# Patient Record
Sex: Male | Born: 1985
Health system: Southern US, Community
[De-identification: ages and names within clinical notes are randomized; demographics above are authoritative.]

## PROBLEM LIST (undated history)

## (undated) DIAGNOSIS — L309 Dermatitis, unspecified: Secondary | ICD-10-CM

## (undated) HISTORY — PX: APPENDECTOMY: SHX54

---

## 2005-10-25 ENCOUNTER — Emergency Department (HOSPITAL_COMMUNITY): Admission: EM | Admit: 2005-10-25 | Discharge: 2005-10-25 | Payer: Self-pay | Admitting: Emergency Medicine

## 2005-11-06 ENCOUNTER — Emergency Department (HOSPITAL_COMMUNITY): Admission: EM | Admit: 2005-11-06 | Discharge: 2005-11-06 | Payer: Self-pay | Admitting: *Deleted

## 2006-03-12 ENCOUNTER — Ambulatory Visit: Payer: Self-pay | Admitting: Family Medicine

## 2006-03-13 ENCOUNTER — Ambulatory Visit: Payer: Self-pay | Admitting: *Deleted

## 2006-06-12 ENCOUNTER — Ambulatory Visit: Payer: Self-pay | Admitting: Family Medicine

## 2006-06-18 ENCOUNTER — Ambulatory Visit: Payer: Self-pay | Admitting: Family Medicine

## 2006-11-05 ENCOUNTER — Ambulatory Visit: Payer: Self-pay | Admitting: Family Medicine

## 2006-12-10 ENCOUNTER — Ambulatory Visit: Payer: Self-pay | Admitting: Family Medicine

## 2006-12-10 ENCOUNTER — Ambulatory Visit (HOSPITAL_COMMUNITY): Admission: RE | Admit: 2006-12-10 | Discharge: 2006-12-10 | Payer: Self-pay | Admitting: Family Medicine

## 2006-12-24 ENCOUNTER — Emergency Department (HOSPITAL_COMMUNITY): Admission: EM | Admit: 2006-12-24 | Discharge: 2006-12-24 | Payer: Self-pay | Admitting: Emergency Medicine

## 2007-02-12 ENCOUNTER — Encounter (INDEPENDENT_AMBULATORY_CARE_PROVIDER_SITE_OTHER): Payer: Self-pay | Admitting: *Deleted

## 2007-04-06 ENCOUNTER — Emergency Department (HOSPITAL_COMMUNITY): Admission: EM | Admit: 2007-04-06 | Discharge: 2007-04-06 | Payer: Self-pay | Admitting: Emergency Medicine

## 2007-06-14 ENCOUNTER — Emergency Department (HOSPITAL_COMMUNITY): Admission: EM | Admit: 2007-06-14 | Discharge: 2007-06-14 | Payer: Self-pay | Admitting: Emergency Medicine

## 2007-06-16 ENCOUNTER — Emergency Department (HOSPITAL_COMMUNITY): Admission: EM | Admit: 2007-06-16 | Discharge: 2007-06-16 | Payer: Self-pay | Admitting: Emergency Medicine

## 2007-08-15 ENCOUNTER — Ambulatory Visit: Payer: Self-pay | Admitting: Family Medicine

## 2008-03-30 IMAGING — CR DG HIP (WITH OR WITHOUT PELVIS) 2-3V*L*
3 series · 3 of 3 positions shown · non-contrast
Comparison: None.

CLINICAL DATA: 21-year-old male with fall last night and pain to the lateral left hip.
 LEFT HIP ? 3 VIEW:

[t pelvis a.p.]
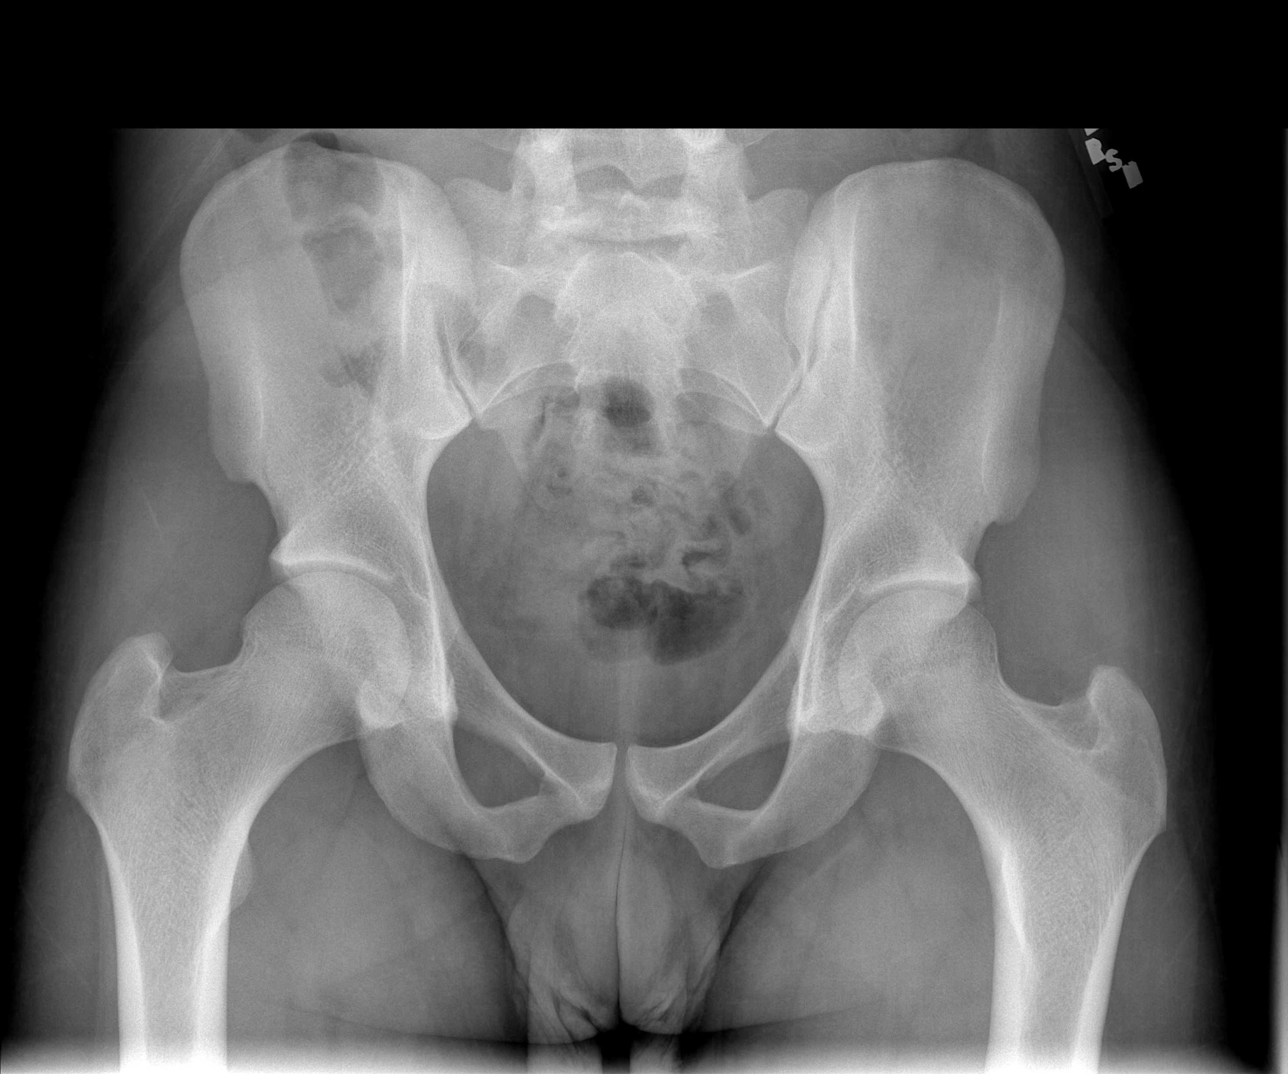

[t hip ap left]
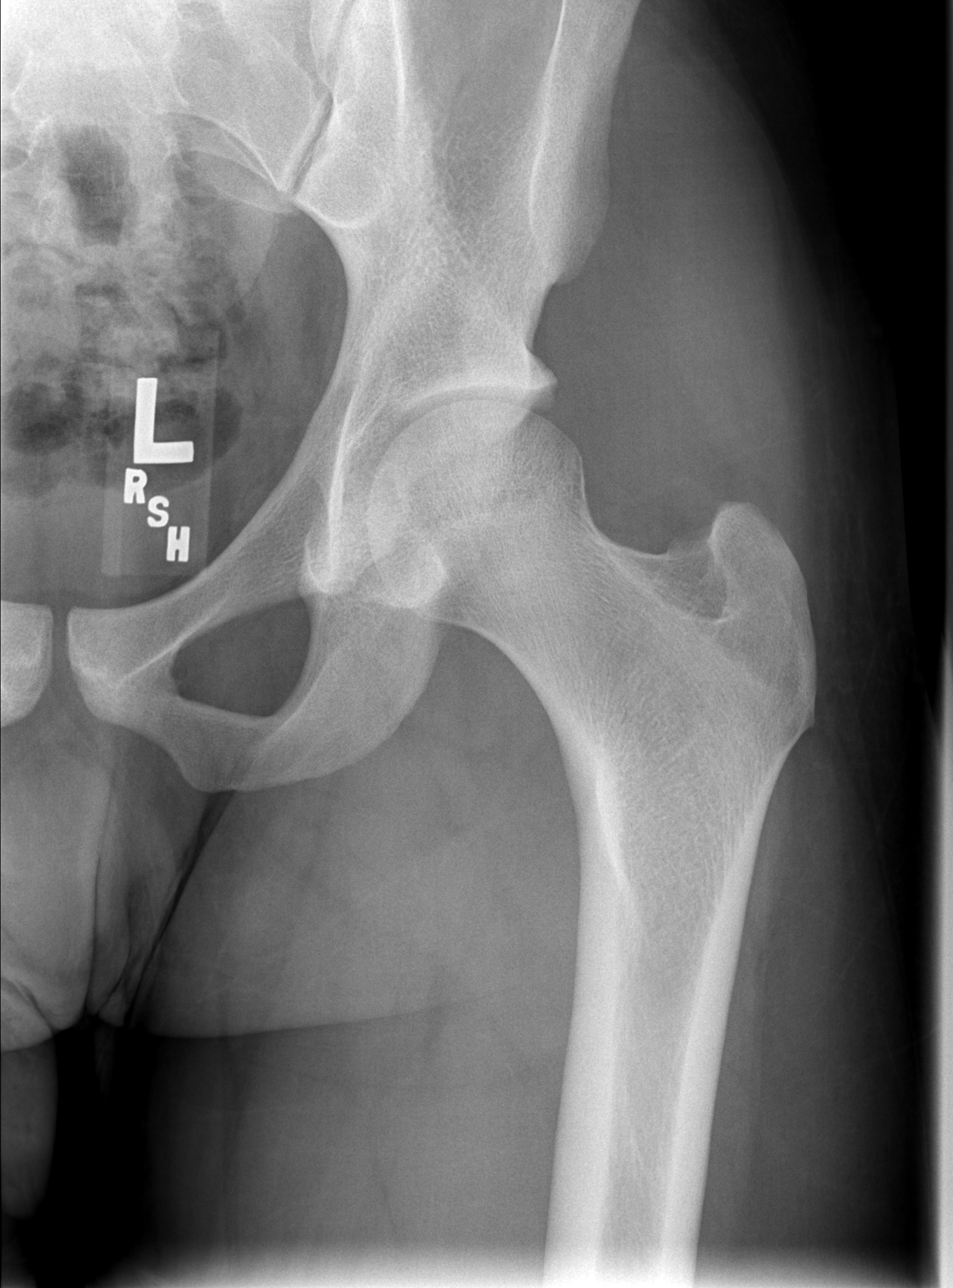

[t hip frog leg left]
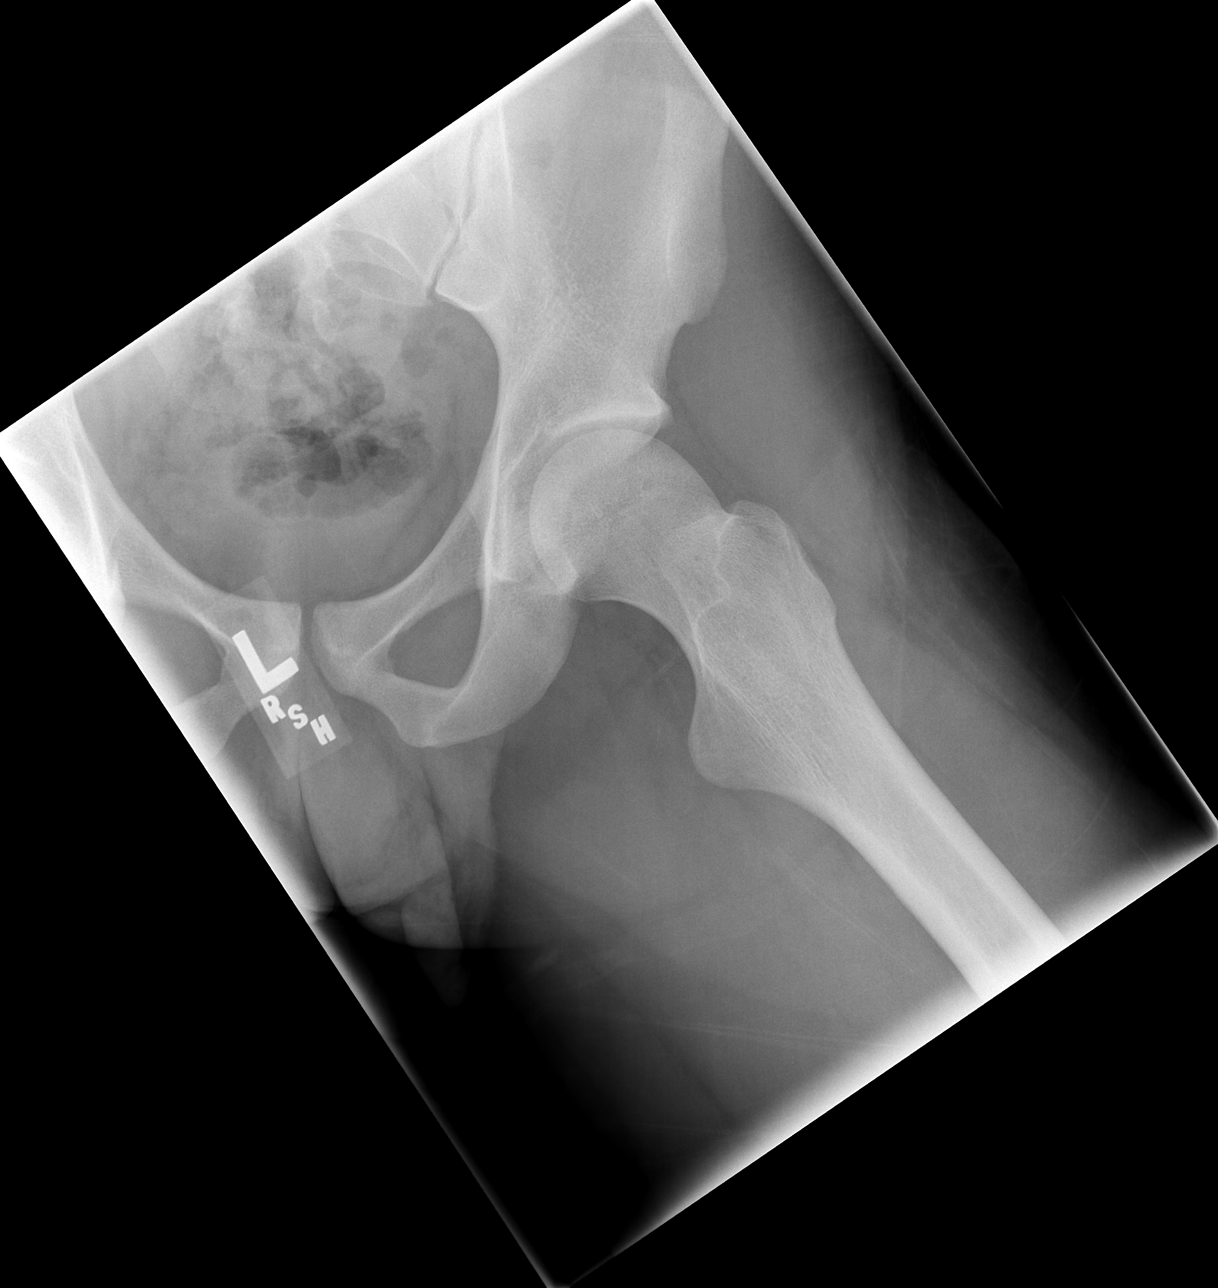

[3 of 3 positions shown; findings below may reference images not displayed]

FINDINGS: Normal bone mineralization.  AP view of the pelvis demonstrates the femoral heads are normally located.  Non-obstructed bowel gas pattern. The sacrum appears intact.  AP and frogleg lateral views of the left hip demonstrate normal appearance of the proximal left femur.  Normal joint space.
IMPRESSION: No acute fracture/dislocation seen about the left hip or pelvis.

## 2008-04-24 ENCOUNTER — Emergency Department (HOSPITAL_COMMUNITY): Admission: EM | Admit: 2008-04-24 | Discharge: 2008-04-24 | Payer: Self-pay | Admitting: Emergency Medicine

## 2008-09-04 ENCOUNTER — Emergency Department (HOSPITAL_COMMUNITY): Admission: EM | Admit: 2008-09-04 | Discharge: 2008-09-04 | Payer: Self-pay | Admitting: Emergency Medicine

## 2009-01-07 ENCOUNTER — Ambulatory Visit: Payer: Self-pay | Admitting: Family Medicine

## 2009-02-13 ENCOUNTER — Emergency Department (HOSPITAL_COMMUNITY): Admission: EM | Admit: 2009-02-13 | Discharge: 2009-02-13 | Payer: Self-pay | Admitting: Emergency Medicine

## 2009-03-22 ENCOUNTER — Observation Stay (HOSPITAL_COMMUNITY): Admission: EM | Admit: 2009-03-22 | Discharge: 2009-03-22 | Payer: Self-pay | Admitting: Emergency Medicine

## 2009-04-28 ENCOUNTER — Emergency Department (HOSPITAL_COMMUNITY): Admission: EM | Admit: 2009-04-28 | Discharge: 2009-04-28 | Payer: Self-pay | Admitting: Emergency Medicine

## 2009-05-10 ENCOUNTER — Observation Stay (HOSPITAL_COMMUNITY): Admission: EM | Admit: 2009-05-10 | Discharge: 2009-05-10 | Payer: Self-pay | Admitting: Emergency Medicine

## 2009-06-28 ENCOUNTER — Emergency Department (HOSPITAL_COMMUNITY): Admission: EM | Admit: 2009-06-28 | Discharge: 2009-06-28 | Payer: Self-pay | Admitting: Emergency Medicine

## 2010-01-03 ENCOUNTER — Ambulatory Visit: Payer: Self-pay | Admitting: Family Medicine

## 2010-09-16 ENCOUNTER — Emergency Department (HOSPITAL_COMMUNITY)
Admission: EM | Admit: 2010-09-16 | Discharge: 2010-09-16 | Disposition: A | Discharge status: Home / Self Care | Payer: Self-pay | Attending: Emergency Medicine | Admitting: Emergency Medicine

## 2010-09-16 DIAGNOSIS — R0789 Other chest pain: Secondary | ICD-10-CM | POA: Insufficient documentation

## 2010-09-16 DIAGNOSIS — R0602 Shortness of breath: Secondary | ICD-10-CM | POA: Insufficient documentation

## 2010-09-16 DIAGNOSIS — R0609 Other forms of dyspnea: Secondary | ICD-10-CM | POA: Insufficient documentation

## 2010-09-16 DIAGNOSIS — R0989 Other specified symptoms and signs involving the circulatory and respiratory systems: Secondary | ICD-10-CM | POA: Insufficient documentation

## 2010-09-16 DIAGNOSIS — J45901 Unspecified asthma with (acute) exacerbation: Secondary | ICD-10-CM | POA: Insufficient documentation

## 2011-01-02 ENCOUNTER — Inpatient Hospital Stay (INDEPENDENT_AMBULATORY_CARE_PROVIDER_SITE_OTHER)
Admission: RE | Admit: 2011-01-02 | Discharge: 2011-01-02 | Disposition: A | Discharge status: Home / Self Care | Payer: Self-pay | Source: Ambulatory Visit | Attending: Family Medicine | Admitting: Family Medicine

## 2011-01-02 DIAGNOSIS — J069 Acute upper respiratory infection, unspecified: Secondary | ICD-10-CM

## 2011-07-13 ENCOUNTER — Emergency Department (HOSPITAL_COMMUNITY)
Admission: EM | Admit: 2011-07-13 | Discharge: 2011-07-13 | Disposition: A | Discharge status: Home / Self Care | Payer: Self-pay | Attending: Emergency Medicine | Admitting: Emergency Medicine

## 2011-07-13 ENCOUNTER — Encounter (HOSPITAL_COMMUNITY): Payer: Self-pay | Admitting: *Deleted

## 2011-07-13 DIAGNOSIS — R05 Cough: Secondary | ICD-10-CM | POA: Insufficient documentation

## 2011-07-13 DIAGNOSIS — R062 Wheezing: Secondary | ICD-10-CM | POA: Insufficient documentation

## 2011-07-13 DIAGNOSIS — R059 Cough, unspecified: Secondary | ICD-10-CM | POA: Insufficient documentation

## 2011-07-13 DIAGNOSIS — F17200 Nicotine dependence, unspecified, uncomplicated: Secondary | ICD-10-CM | POA: Insufficient documentation

## 2011-07-13 DIAGNOSIS — R6883 Chills (without fever): Secondary | ICD-10-CM | POA: Insufficient documentation

## 2011-07-13 DIAGNOSIS — R0602 Shortness of breath: Secondary | ICD-10-CM | POA: Insufficient documentation

## 2011-07-13 DIAGNOSIS — J40 Bronchitis, not specified as acute or chronic: Secondary | ICD-10-CM | POA: Insufficient documentation

## 2011-07-13 MED ORDER — ALBUTEROL SULFATE HFA 108 (90 BASE) MCG/ACT IN AERS
2.0000 | INHALATION_SPRAY | RESPIRATORY_TRACT | Status: DC | PRN
  Administered 2011-07-13: 2 via RESPIRATORY_TRACT
  Filled 2011-07-13: qty 6.7

## 2011-07-13 MED ORDER — ALBUTEROL SULFATE HFA 108 (90 BASE) MCG/ACT IN AERS: 1.0000 | INHALATION_SPRAY | Freq: Four times a day (QID) | RESPIRATORY_TRACT | Status: DC | PRN

## 2011-07-13 MED ORDER — PREDNISONE 20 MG PO TABS
60.0000 mg | ORAL_TABLET | Freq: Once | ORAL | Status: AC
  Administered 2011-07-13: 60 mg via ORAL
  Filled 2011-07-13: qty 3

## 2011-07-13 MED ORDER — PREDNISONE 50 MG PO TABS: ORAL_TABLET | ORAL | Status: DC

## 2011-07-13 MED ORDER — AEROCHAMBER PLUS W/MASK MISC
Status: AC
  Filled 2011-07-13: qty 1

## 2011-07-13 MED ORDER — PREDNISONE 50 MG PO TABS: ORAL_TABLET | ORAL | Status: AC

## 2011-07-13 NOTE — ED Notes (Signed)
PT reports feeling better; lung sounds are clearer with less wheezing; A&Ox3; respirations even and unlabored; skin warm and dry; no signs of distress; no questions at this time.

## 2011-07-13 NOTE — ED Provider Notes (Signed)
History     CSN: [id number]  Arrival date & time 07/13/11  0400   First MD Initiated Contact with Patient 07/13/11 [phone]      Chief Complaint  Patient presents with  . Influenza     Patient is a 26 y.o. male presenting with cough. The history is provided by the patient.  Cough This is a new problem. The problem occurs hourly. The problem has been gradually worsening. The cough is non-productive. Associated symptoms include chills, shortness of breath and wheezing. His past medical history is significant for asthma.  pt reports cough/congestion for past 2 days Also reports wheezing and not having albuterol at home He reports h/o similar symptoms in the past  Past Medical History  Diagnosis Date  . Asthma     History reviewed. No pertinent past surgical history.  History reviewed. No pertinent family history.  History  Substance Use Topics  . Smoking status: Current Everyday Smoker  . Smokeless tobacco: Not on file  . Alcohol Use: Yes      Review of Systems  Constitutional: Positive for chills.  Respiratory: Positive for cough, shortness of breath and wheezing.     Allergies  Shrimp  Home Medications   Current Outpatient Rx  Name Route Sig Dispense Refill  . ALBUTEROL SULFATE HFA 108 (90 BASE) MCG/ACT IN AERS Inhalation Inhale 2 puffs into the lungs every 6 (six) hours as needed. For breathing    . LEVOCETIRIZINE DIHYDROCHLORIDE 5 MG PO TABS Oral Take 5 mg by mouth every evening.    . ALBUTEROL SULFATE HFA 108 (90 BASE) MCG/ACT IN AERS Inhalation Inhale 1-2 puffs into the lungs every 6 (six) hours as needed for wheezing. 1 Inhaler 0  . PREDNISONE 50 MG PO TABS  One tablet PO daily for 4 days 4 tablet 0    BP 129/82  Pulse 92  Temp(Src) 98.9 F (37.2 C) (Oral)  Resp 20  SpO2 95%  Physical Exam CONSTITUTIONAL: Well developed/well nourished HEAD AND FACE: Normocephalic/atraumatic EYES: EOMI/PERRL ENMT: Mucous membranes moist, nasal congestion NECK:  supple no meningeal signs SPINE:entire spine nontender CV: S1/S2 noted, no murmurs/rubs/gallops noted LUNGS: wheezing bilaterally, but able to speak to me clearly, no distress ABDOMEN: soft, nontender, no rebound or guarding GU:no cva tenderness NEURO: Pt is awake/alert, moves all extremitiesx4 EXTREMITIES: pulses normal, full ROM SKIN: warm, color normal PSYCH: no abnormalities of mood noted  ED Course  Procedures     1. Bronchitis       MDM  Nursing notes reviewed and considered in documentation   Pt well appearing, stable for outpatient management, defer xray at this time  The patient appears reasonably screened and/or stabilized for discharge and I doubt any other medical condition or other [facility] requiring further screening, evaluation, or treatment in the ED at this time prior to discharge.         Joya Gaskins, MD 07/13/11 [phone]

## 2011-07-13 NOTE — ED Notes (Signed)
Cold cough temp wheezing since wednesday

## 2012-06-24 ENCOUNTER — Emergency Department (HOSPITAL_COMMUNITY): Payer: Self-pay

## 2012-06-24 ENCOUNTER — Emergency Department (HOSPITAL_COMMUNITY)
Admission: EM | Admit: 2012-06-24 | Discharge: 2012-06-24 | Disposition: A | Discharge status: Home / Self Care | Payer: Self-pay | Attending: Emergency Medicine | Admitting: Emergency Medicine

## 2012-06-24 ENCOUNTER — Encounter (HOSPITAL_COMMUNITY): Payer: Self-pay | Admitting: *Deleted

## 2012-06-24 DIAGNOSIS — R509 Fever, unspecified: Secondary | ICD-10-CM | POA: Insufficient documentation

## 2012-06-24 DIAGNOSIS — Z872 Personal history of diseases of the skin and subcutaneous tissue: Secondary | ICD-10-CM | POA: Insufficient documentation

## 2012-06-24 DIAGNOSIS — F17200 Nicotine dependence, unspecified, uncomplicated: Secondary | ICD-10-CM | POA: Insufficient documentation

## 2012-06-24 DIAGNOSIS — R0989 Other specified symptoms and signs involving the circulatory and respiratory systems: Secondary | ICD-10-CM | POA: Insufficient documentation

## 2012-06-24 DIAGNOSIS — R0602 Shortness of breath: Secondary | ICD-10-CM | POA: Insufficient documentation

## 2012-06-24 DIAGNOSIS — Z79899 Other long term (current) drug therapy: Secondary | ICD-10-CM | POA: Insufficient documentation

## 2012-06-24 DIAGNOSIS — R1110 Vomiting, unspecified: Secondary | ICD-10-CM | POA: Insufficient documentation

## 2012-06-24 DIAGNOSIS — R079 Chest pain, unspecified: Secondary | ICD-10-CM | POA: Insufficient documentation

## 2012-06-24 DIAGNOSIS — J45901 Unspecified asthma with (acute) exacerbation: Secondary | ICD-10-CM | POA: Insufficient documentation

## 2012-06-24 DIAGNOSIS — R0609 Other forms of dyspnea: Secondary | ICD-10-CM | POA: Insufficient documentation

## 2012-06-24 HISTORY — DX: Dermatitis, unspecified: L30.9

## 2012-06-24 MED ORDER — ALBUTEROL SULFATE HFA 108 (90 BASE) MCG/ACT IN AERS
2.0000 | INHALATION_SPRAY | Freq: Once | RESPIRATORY_TRACT | Status: AC
  Administered 2012-06-24: 2 via RESPIRATORY_TRACT
  Filled 2012-06-24: qty 6.7

## 2012-06-24 MED ORDER — ACETAMINOPHEN 160 MG/5ML PO SOLN
650.0000 mg | Freq: Once | ORAL | Status: AC
  Administered 2012-06-24: 650 mg via ORAL
  Filled 2012-06-24: qty 20.3

## 2012-06-24 MED ORDER — ALBUTEROL SULFATE (5 MG/ML) 0.5% IN NEBU
5.0000 mg | INHALATION_SOLUTION | Freq: Once | RESPIRATORY_TRACT | Status: AC
  Administered 2012-06-24: 5 mg via RESPIRATORY_TRACT
  Filled 2012-06-24: qty 40

## 2012-06-24 MED ORDER — PREDNISONE 20 MG PO TABS
60.0000 mg | ORAL_TABLET | Freq: Once | ORAL | Status: AC
  Administered 2012-06-24: 60 mg via ORAL
  Filled 2012-06-24: qty 3

## 2012-06-24 MED ORDER — ONDANSETRON HCL 8 MG PO TABS
4.0000 mg | ORAL_TABLET | Freq: Once | ORAL | Status: AC
  Administered 2012-06-24: 4 mg via ORAL
  Filled 2012-06-24: qty 1

## 2012-06-24 NOTE — ED Provider Notes (Signed)
Complains of cough, nonproductive onset yesterday but not shortness of breath. Patient's breathing much improved and almost normal after treatment in the emergency department. On exam alert nontoxic not acutely ill-appearing speaks in paragraphs lungs clear auscultation no distress  Doug Sou, MD 06/24/12 1526

## 2012-06-24 NOTE — ED Provider Notes (Signed)
History     CSN: [id number]  Arrival date & time 06/24/12  1136   First MD Initiated Contact with Patient 06/24/12 1205      Chief Complaint  Patient presents with  . Cough  . Shortness of Breath    (Consider location/radiation/quality/duration/timing/severity/associated sxs/prior treatment) HPI Comments: 27 y.o. male presents today complaining of acute onset cough since last night with secondary dyspnea since this morning. PMHx significant for asthma. Pt states the course has gradually worsened since onset and rates it severe, constant, and localized. Pt tried nebulizer this morning with improvement, but symptoms quickly returned. Pt did not have anything left in his inhaler. Pt admits productive cough, chills, dyspnea, vomiting x1 (food products), current smoker, and fever though he doesn't know how hight. Pt denies congestion, rhinorrhea, ear pain, sick contacts, or diarrhea.   Patient is a 27 y.o. male presenting with cough and shortness of breath.  Cough Associated symptoms include chest pain and shortness of breath. Pertinent negatives include no headaches.  Shortness of Breath  Associated symptoms include chest pain, cough and shortness of breath. Pertinent negatives include no fever.    Past Medical History  Diagnosis Date  . Asthma   . Eczema     Past Surgical History  Procedure Date  . Appendectomy     No family history on file.  History  Substance Use Topics  . Smoking status: Current Every Day Smoker -- 0.5 packs/day    Types: Cigarettes  . Smokeless tobacco: Not on file  . Alcohol Use: Yes     Comment: occasionally      Review of Systems  Constitutional: Negative for fever and diaphoresis.  HENT: Negative for neck pain and neck stiffness.   Eyes: Negative for visual disturbance.  Respiratory: Positive for cough and shortness of breath. Negative for apnea and chest tightness.   Cardiovascular: Positive for chest pain. Negative for palpitations.    Diffuse chest pain  Gastrointestinal: Negative for nausea, vomiting, abdominal pain, diarrhea and constipation.  Genitourinary: Negative for dysuria.  Musculoskeletal: Negative for gait problem.  Skin: Negative for rash.  Neurological: Negative for dizziness, weakness, light-headedness, numbness and headaches.    Allergies  Shrimp  Home Medications   Current Outpatient Rx  Name  Route  Sig  Dispense  Refill  . ALBUTEROL SULFATE HFA 108 (90 BASE) MCG/ACT IN AERS   Inhalation   Inhale 2 puffs into the lungs every 6 (six) hours as needed. For breathing           BP 127/88  Pulse 129  Temp 99.1 F (37.3 C) (Oral)  Resp 24  SpO2 95%  Physical Exam  Nursing note and vitals reviewed. Constitutional: He is oriented to person, place, and time. He appears well-developed and well-nourished.       Febrile  HENT:  Head: Normocephalic and atraumatic.  Eyes: Conjunctivae normal and EOM are normal.  Neck: Normal range of motion. Neck supple.       No meningeal signs  Cardiovascular: Regular rhythm and normal heart sounds.  Exam reveals no gallop and no friction rub.   No murmur heard.      Tachycardic at 129  Pulmonary/Chest: Breath sounds normal. He has no wheezes. He has no rales. He exhibits no tenderness.       Labored breathing, respirations at 24, tripoding, no tenderness to percussion  Abdominal: Soft. Bowel sounds are normal. He exhibits no distension. There is no tenderness. There is no rebound and no guarding.  Musculoskeletal: Normal range of motion. He exhibits no edema and no tenderness.  Neurological: He is alert and oriented to person, place, and time. No cranial nerve deficit.  Skin: Skin is warm and dry. He is not diaphoretic. No erythema.    ED Course  Procedures (including critical care time)  Labs Reviewed - No data to display No results found. Dg Chest 2 View  06/24/2012  *RADIOLOGY REPORT*  Clinical Data: Chest pain and shortness of breath  CHEST - 2  VIEW  Comparison: May 10, 2009  Findings:  Lungs clear.  Heart size and pulmonary vascularity are normal.  No adenopathy. No pneumothorax.  No bone lesions.  IMPRESSION: No abnormality noted.   Original Report Authenticated By: Bretta Bang, M.D.    Diagnosis: acute exacerbation of asthma    MDM  Pt responded to treatment of prednisone and albuterol in the ED. Heart rate decreased (129 - 103) and respirations decreased(24 - 14). Patient ambulated in ED with O2 saturations maintained >90, no current signs of respiratory distress.  Pt states they are breathing at baseline, speaking in complete paragraphs. Pt has been instructed to continue using prescribed medications and to return to ED if symptoms persist or worsen. Discussed importance of smoking cessation and provided resource list. Pt also examined by Dr. Ethelda Chick who is in agreement with discharge plan.  Glade Nurse, PA-C 06/24/12 2159

## 2012-06-24 NOTE — ED Notes (Signed)
Pt with hx of asthma to ED c/o cough and chills since last night.  Pt not wheezing at this time, sats of 95 %, but labored breathing.  Pt took nebx tx at 9 am with improvement.

## 2012-06-26 NOTE — ED Provider Notes (Signed)
Medical screening examination/treatment/procedure(s) were conducted as a shared visit with non-physician practitioner(s) and myself.  I personally evaluated the patient during the encounter  Doug Sou, MD 06/26/12 [phone]

## 2013-10-28 ENCOUNTER — Emergency Department (HOSPITAL_COMMUNITY)
Admission: EM | Admit: 2013-10-28 | Discharge: 2013-10-28 | Disposition: A | Discharge status: Home / Self Care | Payer: Self-pay | Attending: Emergency Medicine | Admitting: Emergency Medicine

## 2013-10-28 ENCOUNTER — Encounter (HOSPITAL_COMMUNITY): Payer: Self-pay | Admitting: Emergency Medicine

## 2013-10-28 ENCOUNTER — Emergency Department (HOSPITAL_COMMUNITY): Payer: Self-pay

## 2013-10-28 DIAGNOSIS — J45909 Unspecified asthma, uncomplicated: Secondary | ICD-10-CM | POA: Insufficient documentation

## 2013-10-28 DIAGNOSIS — R638 Other symptoms and signs concerning food and fluid intake: Secondary | ICD-10-CM | POA: Insufficient documentation

## 2013-10-28 DIAGNOSIS — Z79899 Other long term (current) drug therapy: Secondary | ICD-10-CM | POA: Insufficient documentation

## 2013-10-28 DIAGNOSIS — R319 Hematuria, unspecified: Secondary | ICD-10-CM | POA: Insufficient documentation

## 2013-10-28 DIAGNOSIS — F17200 Nicotine dependence, unspecified, uncomplicated: Secondary | ICD-10-CM | POA: Insufficient documentation

## 2013-10-28 DIAGNOSIS — Z872 Personal history of diseases of the skin and subcutaneous tissue: Secondary | ICD-10-CM | POA: Insufficient documentation

## 2013-10-28 DIAGNOSIS — R197 Diarrhea, unspecified: Secondary | ICD-10-CM | POA: Insufficient documentation

## 2013-10-28 DIAGNOSIS — R112 Nausea with vomiting, unspecified: Secondary | ICD-10-CM | POA: Insufficient documentation

## 2013-10-28 LAB — COMPREHENSIVE METABOLIC PANEL
ALK PHOS: 63 U/L (ref 39–117)
ALT: 33 U/L (ref 0–53)
AST: 26 U/L (ref 0–37)
Albumin: 4.2 g/dL (ref 3.5–5.2)
BILIRUBIN TOTAL: 0.4 mg/dL (ref 0.3–1.2)
BUN: 10 mg/dL (ref 6–23)
CALCIUM: 9.5 mg/dL (ref 8.4–10.5)
CHLORIDE: 100 meq/L (ref 96–112)
CO2: 21 meq/L (ref 19–32)
Creatinine, Ser: 1.32 mg/dL (ref 0.50–1.35)
GFR calc Af Amer: 84 mL/min — ABNORMAL LOW (ref >90)
GFR calc non Af Amer: 72 mL/min — ABNORMAL LOW (ref >90)
Glucose, Bld: 84 mg/dL (ref 70–99)
POTASSIUM: 3.8 meq/L (ref 3.7–5.3)
Sodium: 138 mEq/L (ref 137–147)
TOTAL PROTEIN: 8.1 g/dL (ref 6.0–8.3)

## 2013-10-28 LAB — URINE MICROSCOPIC-ADD ON

## 2013-10-28 LAB — CBC WITH DIFFERENTIAL/PLATELET
BASOS ABS: 0.1 10*3/uL (ref 0.0–0.1)
BASOS PCT: 1 % (ref 0–1)
EOS ABS: 0 10*3/uL (ref 0.0–0.7)
Eosinophils Relative: 0 % (ref 0–5)
HEMATOCRIT: 47.3 % (ref 39.0–52.0)
HEMOGLOBIN: 16.9 g/dL (ref 13.0–17.0)
LYMPHS ABS: 2.1 10*3/uL (ref 0.7–4.0)
Lymphocytes Relative: 24 % (ref 12–46)
MCH: 31 pg (ref 26.0–34.0)
MCHC: 35.7 g/dL (ref 30.0–36.0)
MCV: 86.6 fL (ref 78.0–100.0)
Monocytes Absolute: 2 10*3/uL — ABNORMAL HIGH (ref 0.1–1.0)
Monocytes Relative: 23 % — ABNORMAL HIGH (ref 3–12)
NEUTROS PCT: 52 % (ref 43–77)
Neutro Abs: 4.4 10*3/uL (ref 1.7–7.7)
Platelets: 220 10*3/uL (ref 150–400)
RBC: 5.46 MIL/uL (ref 4.22–5.81)
RDW: 13.4 % (ref 11.5–15.5)
WBC: 8.5 10*3/uL (ref 4.0–10.5)

## 2013-10-28 LAB — URINALYSIS, ROUTINE W REFLEX MICROSCOPIC
GLUCOSE, UA: NEGATIVE mg/dL
Ketones, ur: 15 mg/dL — AB
LEUKOCYTES UA: NEGATIVE
NITRITE: NEGATIVE
PH: 5.5 (ref 5.0–8.0)
Protein, ur: 30 mg/dL — AB
SPECIFIC GRAVITY, URINE: 1.036 — AB (ref 1.005–1.030)
Urobilinogen, UA: 0.2 mg/dL (ref 0.0–1.0)

## 2013-10-28 LAB — LIPASE, BLOOD: LIPASE: 18 U/L (ref 11–59)

## 2013-10-28 LAB — POC OCCULT BLOOD, ED: Fecal Occult Bld: NEGATIVE

## 2013-10-28 MED ORDER — SODIUM CHLORIDE 0.9 % IV BOLUS (SEPSIS)
1000.0000 mL | Freq: Once | INTRAVENOUS | Status: AC
  Administered 2013-10-28: 1000 mL via INTRAVENOUS

## 2013-10-28 MED ORDER — ONDANSETRON 4 MG PO TBDP: 4.0000 mg | ORAL_TABLET | Freq: Three times a day (TID) | ORAL | Status: DC | PRN

## 2013-10-28 MED ORDER — IPRATROPIUM-ALBUTEROL 0.5-2.5 (3) MG/3ML IN SOLN
3.0000 mL | Freq: Once | RESPIRATORY_TRACT | Status: AC
  Administered 2013-10-28: 3 mL via RESPIRATORY_TRACT
  Filled 2013-10-28: qty 3

## 2013-10-28 MED ORDER — ALBUTEROL SULFATE HFA 108 (90 BASE) MCG/ACT IN AERS
2.0000 | INHALATION_SPRAY | Freq: Once | RESPIRATORY_TRACT | Status: AC
  Administered 2013-10-28: 2 via RESPIRATORY_TRACT
  Filled 2013-10-28: qty 6.7

## 2013-10-28 NOTE — ED Provider Notes (Signed)
CSN: [id number]     Arrival date & time 10/28/13  1748 History   First MD Initiated Contact with Patient 10/28/13 1945     Chief Complaint  Patient presents with  . Diarrhea   HPI  Harold PiperWilliam Morales is a 28 y.o. male with a PMH of asthma and eczema who presents to the ED for evaluation of diarrhea. History was provided by the patient. Patient states he has had diarrhea, nausea and vomiting for the past 6 days. He describes multiple episodes of watery dark brown-black stool. No BRB, rectal pain or rectal bleeding. He also has had intermittent epigastric "heart burn" pain, but denies any abdominal pain currently. He also has had multiple episodes of vomiting and retching. No hematemesis, dysuria, penile discharge, or testicular pain. He also has had fatigue and generalized weakness. No known sick contacts. No recent travel or ingestion of abnormal foods. He has had non-productive cough as well and is a current smoker. Is out of his inhaler. Denies any chest pain or SOB. No fever.    Past Medical History  Diagnosis Date  . Asthma   . Eczema    Past Surgical History  Procedure Laterality Date  . Appendectomy     No family history on file. History  Substance Use Topics  . Smoking status: Current Every Day Smoker -- 0.50 packs/day    Types: Cigarettes  . Smokeless tobacco: Not on file  . Alcohol Use: Yes     Comment: occasionally    Review of Systems  Constitutional: Positive for chills, activity change, appetite change and fatigue. Negative for fever.  HENT: Negative for congestion, rhinorrhea and sore throat.   Respiratory: Positive for cough and wheezing. Negative for shortness of breath.   Cardiovascular: Negative for chest pain and leg swelling.  Gastrointestinal: Positive for nausea, vomiting, abdominal pain and diarrhea. Negative for constipation, blood in stool and anal bleeding.  Genitourinary: Negative for dysuria, urgency, hematuria, decreased urine volume, scrotal swelling,  difficulty urinating, penile pain and testicular pain.  Musculoskeletal: Negative for back pain and myalgias.  Neurological: Positive for weakness (generalized). Negative for dizziness, light-headedness and headaches.    Allergies  Shrimp  Home Medications   Prior to Admission medications   Medication Sig Start Date End Date Taking? Authorizing Provider  albuterol (PROVENTIL HFA;VENTOLIN HFA) 108 (90 BASE) MCG/ACT inhaler Inhale 2 puffs into the lungs every 6 (six) hours as needed. For breathing    Historical Provider, MD   BP 122/61  Pulse 117  Temp(Src) 100.1 F (37.8 C) (Oral)  Resp 21  Ht 5\' 9"  (1.753 m)  Wt 223 lb 12.8 oz (101.515 kg)  BMI 33.03 kg/m2  SpO2 96%  Filed Vitals:   10/28/13 2130 10/28/13 2145 10/28/13 2200 10/28/13 2215  BP: 125/67 144/72 129/78 126/64  Pulse: 99 95 91 90  Temp:      TempSrc:      Resp: 25 11 12 11   Height:      Weight:      SpO2: 95% 97% 99% 99%    Physical Exam  Nursing note and vitals reviewed. Constitutional: He is oriented to person, place, and time. He appears well-developed and well-nourished. No distress.  HENT:  Head: Normocephalic and atraumatic.  Right Ear: External ear normal.  Left Ear: External ear normal.  Nose: Nose normal.  Mouth/Throat: Oropharynx is clear and moist. No oropharyngeal exudate.  Eyes: Conjunctivae and EOM are normal. Pupils are equal, round, and reactive to light. Right eye exhibits no  discharge. Left eye exhibits no discharge.  Neck: Normal range of motion. Neck supple.  Cardiovascular: Normal rate, regular rhythm and normal heart sounds.  Exam reveals no gallop and no friction rub.   No murmur heard. Pulmonary/Chest: Effort normal. No respiratory distress. He has wheezes. He has no rales. He exhibits no tenderness.  Inspiratory and expiratory wheezing throughout. Patient coughing.   Abdominal: Soft. Bowel sounds are normal. He exhibits no distension and no mass. There is no tenderness. There is no  rebound and no guarding.  Genitourinary:  No hemorrhoids or anal fissures. No palpable stool in the rectal vault. No gross blood.   Musculoskeletal: Normal range of motion. He exhibits no edema and no tenderness.  No LE edema or calf tenderness bilaterally  Neurological: He is alert and oriented to person, place, and time.  Skin: Skin is warm and dry. No rash noted. He is not diaphoretic.     ED Course  Procedures (including critical care time) Labs Review Labs Reviewed  CBC WITH DIFFERENTIAL - Abnormal; Notable for the following:    Monocytes Relative 23 (*)    Monocytes Absolute 2.0 (*)    All other components within normal limits  COMPREHENSIVE METABOLIC PANEL - Abnormal; Notable for the following:    GFR calc non Af Amer 72 (*)    GFR calc Af Amer 84 (*)    All other components within normal limits  LIPASE, BLOOD  URINALYSIS, ROUTINE W REFLEX MICROSCOPIC    Imaging Review Dg Chest 2 View  10/28/2013   CLINICAL DATA:  Chest pain.  Smoker.  EXAM: CHEST  2 VIEW  COMPARISON:  PA and lateral chest 06/24/2012.  FINDINGS: The lungs are clear. Heart size is normal. There is no pneumothorax or pleural effusion. No focal bony abnormality is identified.  IMPRESSION: No acute disease.   Electronically Signed   By: Drusilla Kanner M.D.   On: 10/28/2013 20:46     EKG Interpretation   Date/Time:  Wednesday October 28 2013 19:46:56 EDT Ventricular Rate:  113 PR Interval:  144 QRS Duration: 86 QT Interval:  309 QTC Calculation: 424 R Axis:   60 Text Interpretation:  Sinus tachycardia LAE, consider biatrial enlargement  early repolarization No significant change since last tracing Confirmed by  HARRISON  MD, FORREST ([id number]) on 10/29/2013 11:14:16 AM      Results for orders placed during the hospital encounter of 10/28/13  CBC WITH DIFFERENTIAL      Result Value Ref Range   WBC 8.5  4.0 - 10.5 K/uL   RBC 5.46  4.22 - 5.81 MIL/uL   Hemoglobin 16.9  13.0 - 17.0 g/dL   HCT 13.0  86.5 -  78.4 %   MCV 86.6  78.0 - 100.0 fL   MCH 31.0  26.0 - 34.0 pg   MCHC 35.7  30.0 - 36.0 g/dL   RDW 69.6  29.5 - 28.4 %   Platelets 220  150 - 400 K/uL   Neutrophils Relative % 52  43 - 77 %   Neutro Abs 4.4  1.7 - 7.7 K/uL   Lymphocytes Relative 24  12 - 46 %   Lymphs Abs 2.1  0.7 - 4.0 K/uL   Monocytes Relative 23 (*) 3 - 12 %   Monocytes Absolute 2.0 (*) 0.1 - 1.0 K/uL   Eosinophils Relative 0  0 - 5 %   Eosinophils Absolute 0.0  0.0 - 0.7 K/uL   Basophils Relative 1  0 - 1 %  Basophils Absolute 0.1  0.0 - 0.1 K/uL  COMPREHENSIVE METABOLIC PANEL      Result Value Ref Range   Sodium 138  137 - 147 mEq/L   Potassium 3.8  3.7 - 5.3 mEq/L   Chloride 100  96 - 112 mEq/L   CO2 21  19 - 32 mEq/L   Glucose, Bld 84  70 - 99 mg/dL   BUN 10  6 - 23 mg/dL   Creatinine, Ser 9.41  0.50 - 1.35 mg/dL   Calcium 9.5  8.4 - 74.0 mg/dL   Total Protein 8.1  6.0 - 8.3 g/dL   Albumin 4.2  3.5 - 5.2 g/dL   AST 26  0 - 37 U/L   ALT 33  0 - 53 U/L   Alkaline Phosphatase 63  39 - 117 U/L   Total Bilirubin 0.4  0.3 - 1.2 mg/dL   GFR calc non Af Amer 72 (*) >90 mL/min   GFR calc Af Amer 84 (*) >90 mL/min  LIPASE, BLOOD      Result Value Ref Range   Lipase 18  11 - 59 U/L  URINALYSIS, ROUTINE W REFLEX MICROSCOPIC      Result Value Ref Range   Color, Urine YELLOW  YELLOW   APPearance HAZY (*) CLEAR   Specific Gravity, Urine 1.036 (*) 1.005 - 1.030   pH 5.5  5.0 - 8.0   Glucose, UA NEGATIVE  NEGATIVE mg/dL   Hgb urine dipstick LARGE (*) NEGATIVE   Bilirubin Urine SMALL (*) NEGATIVE   Ketones, ur 15 (*) NEGATIVE mg/dL   Protein, ur 30 (*) NEGATIVE mg/dL   Urobilinogen, UA 0.2  0.0 - 1.0 mg/dL   Nitrite NEGATIVE  NEGATIVE   Leukocytes, UA NEGATIVE  NEGATIVE  URINE MICROSCOPIC-ADD ON      Result Value Ref Range   Squamous Epithelial / LPF RARE  RARE   WBC, UA 0-2  <3 WBC/hpf   RBC / HPF 7-10  <3 RBC/hpf   Bacteria, UA FEW (*) RARE   Urine-Other MUCOUS PRESENT    POC OCCULT BLOOD, ED       Result Value Ref Range   Fecal Occult Bld NEGATIVE  NEGATIVE     MDM   Harold Morales is a 28 y.o. male with a PMH of asthma and eczema who presents to the ED for evaluation of diarrhea. Nausea, vomiting, and diarrhea possibly due to gastroenteritis. Patient had no abdominal pain throughout his ED visit. Abdominal exam benign. Hemoccult negative. Able to tolerate IV fluids before discharge. Mild tachycardia initially which improved with IV fluids. Patient likely dehydrated. Patient also had wheezing which cleared with albuterol inhaler in the ED. Patient has hx of asthma. Chest x-ray negative for an acute cardiopulmonary process. Vital signs stable. Patient found to have hematuria. No UTI. Informed patient to follow-up with PCP regarding this. Labs otherwise unremarkable. Patient encouraged to drink fluids. Return precautions, discharge instructions, and follow-up was discussed with the patient before discharge.    Rechecks  10:30 PM = Patient tolerating fluids without difficulty. No abdominal pain. Lungs clear to auscultation.     Discharge Medication List as of 10/28/2013 10:41 PM    START taking these medications   Details  ondansetron (ZOFRAN ODT) 4 MG disintegrating tablet Take 1 tablet (4 mg total) by mouth every 8 (eight) hours as needed for nausea., Starting 10/28/2013, Until Discontinued, Print        Final impressions: 1. Nausea vomiting and diarrhea   2.  Hematuria   3. Asthma      Greer Ee Chandlar Guice PA-C            Jillyn Ledger, PA-C 10/29/13 [phone]

## 2013-10-28 NOTE — Discharge Instructions (Signed)
Take zofran for nausea and vomiting as needed  Eat a clear liquid diet for 24 hours - drink plenty of water  Your urine had blood in it - please follow-up with your doctor about this Continue to use your albuterol inhaler as needed for wheezing - avoid tobacco use!   Return to the emergency department if you develop any changing/worsening condition, repeated vomiting, blood in stool, abdominal pain, or any other concerns (please read additional information regarding your condition below)    Nausea and Vomiting Nausea is a sick feeling that often comes before throwing up (vomiting). Vomiting is a reflex where stomach contents come out of your mouth. Vomiting can cause severe loss of body fluids (dehydration). Children and elderly adults can become dehydrated quickly, especially if they also have diarrhea. Nausea and vomiting are symptoms of a condition or disease. It is important to find the cause of your symptoms. CAUSES   Direct irritation of the stomach lining. This irritation can result from increased acid production (gastroesophageal reflux disease), infection, food poisoning, taking certain medicines (such as nonsteroidal anti-inflammatory drugs), alcohol use, or tobacco use.  Signals from the brain.These signals could be caused by a headache, heat exposure, an inner ear disturbance, increased pressure in the brain from injury, infection, a tumor, or a concussion, pain, emotional stimulus, or metabolic problems.  An obstruction in the gastrointestinal tract (bowel obstruction).  Illnesses such as diabetes, hepatitis, gallbladder problems, appendicitis, kidney problems, cancer, sepsis, atypical symptoms of a heart attack, or eating disorders.  Medical treatments such as chemotherapy and radiation.  Receiving medicine that makes you sleep (general anesthetic) during surgery. DIAGNOSIS Your caregiver may ask for tests to be done if the problems do not improve after a few days. Tests may  also be done if symptoms are severe or if the reason for the nausea and vomiting is not clear. Tests may include:  Urine tests.  Blood tests.  Stool tests.  Cultures (to look for evidence of infection).  X-rays or other imaging studies. Test results can help your caregiver make decisions about treatment or the need for additional tests. TREATMENT You need to stay well hydrated. Drink frequently but in small amounts.You may wish to drink water, sports drinks, clear broth, or eat frozen ice pops or gelatin dessert to help stay hydrated.When you eat, eating slowly may help prevent nausea.There are also some antinausea medicines that may help prevent nausea. HOME CARE INSTRUCTIONS   Take all medicine as directed by your caregiver.  If you do not have an appetite, do not force yourself to eat. However, you must continue to drink fluids.  If you have an appetite, eat a normal diet unless your caregiver tells you differently.  Eat a variety of complex carbohydrates (rice, wheat, potatoes, bread), lean meats, yogurt, fruits, and vegetables.  Avoid high-fat foods because they are more difficult to digest.  Drink enough water and fluids to keep your urine clear or pale yellow.  If you are dehydrated, ask your caregiver for specific rehydration instructions. Signs of dehydration may include:  Severe thirst.  Dry lips and mouth.  Dizziness.  Dark urine.  Decreasing urine frequency and amount.  Confusion.  Rapid breathing or pulse. SEEK IMMEDIATE MEDICAL CARE IF:   You have blood or brown flecks (like coffee grounds) in your vomit.  You have black or bloody stools.  You have a severe headache or stiff neck.  You are confused.  You have severe abdominal pain.  You have chest pain  or trouble breathing.  You do not urinate at least once every 8 hours.  You develop cold or clammy skin.  You continue to vomit for longer than 24 to 48 hours.  You have a fever. MAKE  SURE YOU:   Understand these instructions.  Will watch your condition.  Will get help right away if you are not doing well or get worse. Document Released: 05/14/2005 Document Revised: 08/06/2011 Document Reviewed: 10/11/2010 [facility] Patient Information 2014 [place], [place].  Diarrhea Diarrhea is frequent loose and watery bowel movements. It can cause you to feel weak and dehydrated. Dehydration can cause you to become tired and thirsty, have a dry mouth, and have decreased urination that often is dark yellow. Diarrhea is a sign of another problem, most often an infection that will not last long. In most cases, diarrhea typically lasts 2 3 days. However, it can last longer if it is a sign of something more serious. It is important to treat your diarrhea as directed by your caregive to lessen or prevent future episodes of diarrhea. CAUSES  Some common causes include:  Gastrointestinal infections caused by viruses, bacteria, or parasites.  Food poisoning or food allergies.  Certain medicines, such as antibiotics, chemotherapy, and laxatives.  Artificial sweeteners and fructose.  Digestive disorders. HOME CARE INSTRUCTIONS  Ensure adequate fluid intake (hydration): have 1 cup (8 oz) of fluid for each diarrhea episode. Avoid fluids that contain simple sugars or sports drinks, fruit juices, whole milk products, and sodas. Your urine should be clear or pale yellow if you are drinking enough fluids. Hydrate with an oral rehydration solution that you can purchase at pharmacies, retail stores, and online. You can prepare an oral rehydration solution at home by mixing the following ingredients together:    tsp table salt.   tsp baking soda.   tsp salt substitute containing potassium chloride.  1  tablespoons sugar.  1 L (34 oz) of water.  Certain foods and beverages may increase the speed at which food moves through the gastrointestinal (GI) tract. These foods and beverages should be  avoided and include:  Caffeinated and alcoholic beverages.  High-fiber foods, such as raw fruits and vegetables, nuts, seeds, and whole grain breads and cereals.  Foods and beverages sweetened with sugar alcohols, such as xylitol, sorbitol, and mannitol.  Some foods may be well tolerated and may help thicken stool including:  Starchy foods, such as rice, toast, pasta, low-sugar cereal, oatmeal, grits, baked potatoes, crackers, and bagels.  Bananas.  Applesauce.  Add probiotic-rich foods to help increase healthy bacteria in the GI tract, such as yogurt and fermented milk products.  Wash your hands well after each diarrhea episode.  Only take over-the-counter or prescription medicines as directed by your caregiver.  Take a warm bath to relieve any burning or pain from frequent diarrhea episodes. SEEK IMMEDIATE MEDICAL CARE IF:   You are unable to keep fluids down.  You have persistent vomiting.  You have blood in your stool, or your stools are black and tarry.  You do not urinate in 6 8 hours, or there is only a small amount of very dark urine.  You have abdominal pain that increases or localizes.  You have weakness, dizziness, confusion, or lightheadedness.  You have a severe headache.  Your diarrhea gets worse or does not get better.  You have a fever or persistent symptoms for more than 2 3 days.  You have a fever and your symptoms suddenly get worse. MAKE SURE YOU:  Understand these instructions.  Will watch your condition.  Will get help right away if you are not doing well or get worse. Document Released: 05/04/2002 Document Revised: 04/30/2012 Document Reviewed: 01/20/2012 [facility] Patient Information 2014 [place], [place].  Asthma, Adult Asthma is a recurring condition in which the airways tighten and narrow. Asthma can make it difficult to breathe. It can cause coughing, wheezing, and shortness of breath. Asthma episodes (also called asthma attacks) range  from minor to life-threatening. Asthma cannot be cured, but medicines and lifestyle changes can help control it. CAUSES Asthma is believed to be caused by inherited (genetic) and environmental factors, but its exact cause is unknown. Asthma may be triggered by allergens, lung infections, or irritants in the air. Asthma triggers are different for each person. Common triggers include:   Animal dander.  Dust mites.  Cockroaches.  Pollen from trees or grass.  Mold.  Smoke.  Air pollutants such as dust, household cleaners, hair sprays, aerosol sprays, paint fumes, strong chemicals, or strong odors.  Cold air, weather changes, and winds (which increase molds and pollens in the air).  Strong emotional expressions such as crying or laughing hard.  Stress.  Certain medicines (such as aspirin) or types of drugs (such as beta-blockers).  Sulfites in foods and drinks. Foods and drinks that may contain sulfites include dried fruit, potato chips, and sparkling grape juice.  Infections or inflammatory conditions such as the flu, a cold, or an inflammation of the nasal membranes (rhinitis).  Gastroesophageal reflux disease (GERD).  Exercise or strenuous activity. SYMPTOMS Symptoms may occur immediately after asthma is triggered or many hours later. Symptoms include:  Wheezing.  Excessive nighttime or early morning coughing.  Frequent or severe coughing with a common cold.  Chest tightness.  Shortness of breath. DIAGNOSIS  The diagnosis of asthma is made by a review of your medical history and a physical exam. Tests may also be performed. These may include:  Lung function studies. These tests show how much air you breath in and out.  Allergy tests.  Imaging tests such as X-rays. TREATMENT  Asthma cannot be cured, but it can usually be controlled. Treatment involves identifying and avoiding your asthma triggers. It also involves medicines. There are 2 classes of medicine used for  asthma treatment:   Controller medicines. These prevent asthma symptoms from occurring. They are usually taken every day.  Reliever or rescue medicines. These quickly relieve asthma symptoms. They are used as needed and provide short-term relief. Your health care provider will help you create an asthma action plan. An asthma action plan is a written plan for managing and treating your asthma attacks. It includes a list of your asthma triggers and how they may be avoided. It also includes information on when medicines should be taken and when their dosage should be changed. An action plan may also involve the use of a device called a peak flow meter. A peak flow meter measures how well the lungs are working. It helps you monitor your condition. HOME CARE INSTRUCTIONS   Take medicine as directed by your health care provider. Speak with your health care provider if you have questions about how or when to take the medicines.  Use a peak flow meter as directed by your health care provider. Record and keep track of readings.  Understand and use the action plan to help minimize or stop an asthma attack without needing to seek medical care.  Control your home environment in the following ways to help  prevent asthma attacks:  Do not smoke. Avoid being exposed to secondhand smoke.  Change your heating and air conditioning filter regularly.  Limit your use of fireplaces and wood stoves.  Get rid of pests (such as roaches and mice) and their droppings.  Throw away plants if you see mold on them.  Clean your floors and dust regularly. Use unscented cleaning products.  Try to have someone else vacuum for you regularly. Stay out of rooms while they are being vacuumed and for a short while afterward. If you vacuum, use a dust mask from a hardware store, a double-layered or microfilter vacuum cleaner bag, or a vacuum cleaner with a HEPA filter.  Replace carpet with wood, tile, or vinyl flooring. Carpet  can trap dander and dust.  Use allergy-proof pillows, mattress covers, and box spring covers.  Wash bed sheets and blankets every week in hot water and dry them in a dryer.  Use blankets that are made of polyester or cotton.  Clean bathrooms and kitchens with bleach. If possible, have someone repaint the walls in these rooms with mold-resistant paint. Keep out of the rooms that are being cleaned and painted.  Wash hands frequently. SEEK MEDICAL CARE IF:   You have wheezing, shortness of breath, or a cough even if taking medicine to prevent attacks.  The colored mucus you cough up (sputum) is thicker than usual.  Your sputum changes from clear or white to yellow, green, gray, or bloody.  You have any problems that may be related to the medicines you are taking (such as a rash, itching, swelling, or trouble breathing).  You are using a reliever medicine more than 2 3 times per week.  Your peak flow is still at 50 79% of you personal best after following your action plan for 1 hour. SEEK IMMEDIATE MEDICAL CARE IF:   You seem to be getting worse and are unresponsive to treatment during an asthma attack.  You are short of breath even at rest.  You get short of breath when doing very little physical activity.  You have difficulty eating, drinking, or talking due to asthma symptoms.  You develop chest pain.  You develop a fast heartbeat.  You have a bluish color to your lips or fingernails.  You are lightheaded, dizzy, or faint.  Your peak flow is less than 50% of your personal best.  You have a fever or persistent symptoms for more than 2 3 days.  You have a fever and symptoms suddenly get worse. MAKE SURE YOU:   Understand these instructions.  Will watch your condition.  Will get help right away if you are not doing well or get worse. Document Released: 05/14/2005 Document Revised: 01/14/2013 Document Reviewed: 12/11/2012 [facility] Patient Information 2014 [place],  [place].  Hematuria, Adult Hematuria is blood in your urine. It can be caused by a bladder infection, kidney infection, prostate infection, kidney stone, or cancer of your urinary tract. Infections can usually be treated with medicine, and a kidney stone usually will pass through your urine. If neither of these is the cause of your hematuria, further workup to find out the reason may be needed. It is very important that you tell your health care provider about any blood you see in your urine, even if the blood stops without treatment or happens without causing pain. Blood in your urine that happens and then stops and then happens again can be a symptom of a very serious condition. Also, pain is not a symptom  in the initial stages of many urinary cancers. HOME CARE INSTRUCTIONS   Drink lots of fluid, 3 4 quarts a day. If you have been diagnosed with an infection, cranberry juice is especially recommended, in addition to large amounts of water.  Avoid caffeine, tea, and carbonated beverages, because they tend to irritate the bladder.  Avoid alcohol because it may irritate the prostate.  Only take over-the-counter or prescription medicines for pain, discomfort, or fever as directed by your health care provider.  If you have been diagnosed with a kidney stone, follow your health care provider's instructions regarding straining your urine to catch the stone.  Empty your bladder often. Avoid holding urine for long periods of time.  After a bowel movement, women should cleanse front to back. Use each tissue only once.  Empty your bladder before and after sexual intercourse if you are a male. SEEK MEDICAL CARE IF: You develop back pain, fever, a feeling of sickness in your stomach (nausea), or vomiting or if your symptoms are not better in 3 days. Return sooner if you are getting worse. SEEK IMMEDIATE MEDICAL CARE IF:   You have a persistent fever, with a temperature of 101.50F (38.8C) or  greater.  You develop severe vomiting and are unable to keep the medicine down.  You develop severe back or abdominal pain despite taking your medicines.  You begin passing a large amount of blood or clots in your urine.  You feel extremely weak or faint, or you pass out. MAKE SURE YOU:   Understand these instructions.  Will watch your condition.  Will get help right away if you are not doing well or get worse. Document Released: 05/14/2005 Document Revised: 03/04/2013 Document Reviewed: 01/12/2013 [facility] Patient Information 2014 [place], [place].   Emergency Department Resource Guide 1) Find a Doctor and Pay Out of Pocket Although you won't have to find out who is covered by your insurance plan, it is a good idea to ask around and get recommendations. You will then need to call the office and see if the doctor you have chosen will accept you as a new patient and what types of options they offer for patients who are self-pay. Some doctors offer discounts or will set up payment plans for their patients who do not have insurance, but you will need to ask so you aren't surprised when you get to your appointment.  2) Contact Your Local Health Department Not all health departments have doctors that can see patients for sick visits, but many do, so it is worth a call to see if yours does. If you don't know where your local health department is, you can check in your phone book. The CDC also has a tool to help you locate your state's health department, and many state websites also have listings of all of their local health departments.  3) Find a Walk-in Clinic If your illness is not likely to be very severe or complicated, you may want to try a walk in clinic. These are popping up all over the country in pharmacies, drugstores, and shopping centers. They're usually staffed by nurse practitioners or physician assistants that have been trained to treat common illnesses and complaints. They're  usually fairly quick and inexpensive. However, if you have serious medical issues or chronic medical problems, these are probably not your best option.  No Primary Care Doctor: - Call Health Connect at  [phone] - they can help you locate a primary care doctor that  accepts your  insurance, provides certain services, etc. - Physician Referral Service- [phone]  Chronic Pain Problems: Organization         Address  Phone   Notes  [facility]  [phone] Patients need to be referred by their primary care doctor.   Medication Assistance: Organization         Address  Phone   Notes  [facility] Medication [facility] [address] [place], [place] [address] [phone] --Must be a resident of [facility] -- Must have NO insurance coverage whatsoever (no Medicaid/ Medicare, etc.) -- The pt. MUST have a primary care doctor that directs their care regularly and follows them in the community   [facility]  [phone]   [employer]  [phone]    Agencies that provide inexpensive medical care: Organization         Address  Phone   Notes  [facility]  [phone]   [facility]    [phone]   [facility] [address] [place], [place] [address] [phone]   [facility] [address] [place] [phone]   [facility]    [phone]   [facility]    [phone]   [facility]  [address], [place] Phone:  [phone], Fax:  [phone] Hours of Operation:  9 am - 6 pm, M-F.  Also accepts Medicaid/Medicare and self-pay.  [facility]  [address], [place] Phone: [phone], Fax: [phone]. Hours of Operation:  8:30 am - 5:30 pm, M-F.  Also accepts Medicaid and self-pay.  [facility] [place] [address], [place] Phone:  [phone]   [facility] [address] Natasha Bence [place], [place] [phone], Ext. [phone] Mondays & Thursdays: 7-9 AM.  First 15 patients are seen on a first come, first serve basis.    Medicaid-accepting [facility] Providers:  Organization         Address  Phone   Notes  [facility] [address], [place] [phone] Also accepts self-pay patients.  [facility] [address] Laurell Josephs [place], [place]  [phone]   [facility] [address], [place] [phone]   [facility] [address] [place] [phone]   Renaye Rakers [address], [place]   [phone] Only accepts [place] patients after they have their name applied to their card.   Self-Pay (no insurance) in [facility]:  Organization         Address  Phone   Notes  Sickle Cell Patients, [facility] Internal Medicine [address] [place], [place] [phone]   [facility] Urgent Care [address] [place], [place] [phone]   [facility] [place]  [address], [place] [phone]   [facility]/Dr. Osei-Bonsu  [address] [place] or [address], [place] [phone] Phone number for both [place] and [place] locations is the same.  [facility] [address] [facility] [phone]   [facility] [address], [place] or [address] [phone]   [facility] [address], [place] [phone], phone; [phone], fax Sees patients 1st and 3rd Saturday of every month.  Must not qualify for public or private insurance (i.e. Medicaid, Medicare, [place] Health Choice, Veterans' Benefits)  Household income should be no more than 200% of the poverty level The clinic cannot treat  you if you are pregnant or think you are pregnant  Sexually transmitted diseases are not treated at the clinic.    Dental Care: Organization         Address  Phone  Notes  [facility] Department of [facility] [address] [place], [place] [phone] Accepts children up to age 68 who are enrolled in [place] or [place] Health Choice; pregnant women with a Medicaid card; and children who have applied for Medicaid or [place] Health Choice, but were declined, whose parents can pay a reduced fee at time of service.  [facility] Department of [facility]  [address], [place] [phone] Accepts children up to age 40 who are enrolled in [place] or [place] Health Choice; pregnant women with a Medicaid card; and children who have applied for Medicaid or  Health Choice, but were declined, whose parents can pay a reduced fee at time of service.  [facility]  [address] [place], [place] [phone] Patients are seen by appointment only. Walk-ins are not accepted. [facility] will see patients 47 years of age and older. Monday - Tuesday (8am-5pm) Most Wednesdays (8:30-5pm) $30 per visit, cash only  [facility]  [address], [place] [phone] Patients are seen by appointment only. Walk-ins are not accepted. [facility] will see patients 77 years of age and older. One Wednesday Evening (Monthly: Volunteer Based).  $30 per visit, cash only  [employer]  [phone] for adults; Children under age 75, call Graduate Pediatric Dentistry at [phone]. Children aged 74-14, please call [phone] to request a pediatric application.  Dental services are provided in all areas of dental care including fillings, crowns and bridges, complete and partial dentures, implants, gum treatment, root canals, and extractions. Preventive care is also provided. Treatment  is provided to both adults and children. Patients are selected via a lottery and there is often a waiting list.   [facility] [address], [place]  [phone] [email or web address]   [facility] [address] [place], [place], [place] [phone], Ext. [phone] Second and Fourth Thursday of each month, opens at 6:30 AM; Clinic ends at 9 AM.  Patients are seen on a first-come first-served basis, and a limited number are seen during each clinic.   [facility]  [address] [place], [place] [phone]   Eligibility Requirements You must have lived in [place], [place], or [place] counties for at least the last three months.   You cannot be eligible for state or federal sponsored [place], including [employer], [place], or [employer].   You generally cannot be eligible for healthcare insurance through your employer.    How to apply: Eligibility screenings are held every Tuesday and Wednesday afternoon from 1:00 pm until 4:00 pm. You do not need an appointment for the interview!  [facility] [address], [place], [place] [phone]   [facility] Health Department  [phone]   [facility] Health Department  [phone]   [facility] Health Department  [phone]  Behavioral Health Resources in the Community: Intensive Outpatient Programs Organization         Address  Phone  Notes  [facility] [address], [place], [place] [phone]   [facility] [address], [place], [place] [phone]   [facility] [address], [place], [place]  [phone]   [facility] [address],  [place], [place] [phone] or [phone]   Substance Abuse Resources Organization         Address  Phone  Notes  [facility]  [phone]   [facility]  [phone]   [facility]  [phone]   [facility]  [phone]   Residential & Outpatient Substance Abuse Program  [phone]   Psychological Services Organization         Address  Phone  Notes  [facility]  [phone]- [phone]   [facility]  [phone]   [facility] [address], [place] [phone] or [phone]    Mobile Crisis Teams Organization         Address  Phone  Notes  [facility], Mobile Crisis Care Unit  [phone]   [facility]  [address] [place], [place] [phone]   Doristine LocksSharon DeEsch [address] [place] [phone]    Self-Help/Support Groups Organization         Address  Phone             Notes  [facility] [place] - variety of support groups  [phone] Call for more information  Narcotics Anonymous (NA), [facility] [address], [place]  2 meetings at this location   Statisticianesidential Treatment Programs Organization         Address  Phone  Notes  [facility] [address],    [place]  [phone]   [facility]  [address], [place] [phone], [place], [place] [phone]   [facility] [address], [place] [phone] Admissions: 8am-3pm M-F  [facility] [address]    [place], [place] [phone]   [facility] [address], [place], [place] [phone]   [facility] [address]  [place], [place] [phone]   [facility] - Intensive Outpatient [address], [place], [place] [phone]   [facility] ([facility] [address]  [place], [place] [phone] or [phone]   [facility] ([facility]) [address] [place], [place] [phone] Accepts Medicaid  [facility] [address]  [place] [phone] Substance Abuse/Addiction Treatment     [facility] Resources Organization         Address  Phone  Notes  [facility]  [phone]   Angie FavaJulie Brannon, PhD [address], [address] [place], [place]   [phone]) [phone] or [phone]) [phone]   [facility]   [address] [place], [place] [phone]) [phone]   [facility] [address], [place], [place] [phone](336) [phone] Insurance/Medicaid/sponsorship through [facility] [address]                                    [place], [place] [phone]) [phone] Therapy/tele-psych/case  [facility] [address]  [place], [place] [phone]    Dr. Adele Schilder  [phone]   [facility] Dept. 1) [address] [place] 2) [place] 3)  [place] [address], [place] [phone]  [phone]   [place] [phone] or [phone] (After Hours)

## 2013-10-28 NOTE — ED Notes (Signed)
Patient transported to X-ray 

## 2013-10-28 NOTE — ED Notes (Signed)
Pt c/o nv with diarrhea and some abd pain since Friday.  Chills also

## 2013-10-30 NOTE — ED Provider Notes (Signed)
Medical screening examination/treatment/procedure(s) were performed by non-physician practitioner and as supervising physician I was immediately available for consultation/collaboration.   EKG Interpretation   Date/Time:  Wednesday October 28 2013 19:46:56 EDT Ventricular Rate:  113 PR Interval:  144 QRS Duration: 86 QT Interval:  309 QTC Calculation: 424 R Axis:   60 Text Interpretation:  Sinus tachycardia LAE, consider biatrial enlargement  early repolarization No significant change since last tracing Confirmed by  Kayleann Mccaffery  MD, Devonta Blanford ([id number]) on 10/29/2013 11:14:16 AM        Randa Spike Mort Sawyers, MD 10/30/13 1535

## 2014-09-12 ENCOUNTER — Encounter (HOSPITAL_COMMUNITY): Payer: Self-pay | Admitting: Family Medicine

## 2014-09-12 ENCOUNTER — Emergency Department (HOSPITAL_COMMUNITY): Payer: Self-pay

## 2014-09-12 ENCOUNTER — Inpatient Hospital Stay (HOSPITAL_COMMUNITY)
Admission: EM | Admit: 2014-09-12 | Discharge: 2014-09-13 | DRG: 202 | Disposition: A | Discharge status: Home / Self Care | Payer: Self-pay | Attending: Internal Medicine | Admitting: Internal Medicine

## 2014-09-12 DIAGNOSIS — J4541 Moderate persistent asthma with (acute) exacerbation: Principal | ICD-10-CM | POA: Diagnosis present

## 2014-09-12 DIAGNOSIS — R030 Elevated blood-pressure reading, without diagnosis of hypertension: Secondary | ICD-10-CM | POA: Diagnosis not present

## 2014-09-12 DIAGNOSIS — J209 Acute bronchitis, unspecified: Secondary | ICD-10-CM | POA: Diagnosis present

## 2014-09-12 DIAGNOSIS — N179 Acute kidney failure, unspecified: Secondary | ICD-10-CM | POA: Diagnosis present

## 2014-09-12 DIAGNOSIS — J45901 Unspecified asthma with (acute) exacerbation: Secondary | ICD-10-CM | POA: Diagnosis present

## 2014-09-12 DIAGNOSIS — E872 Acidosis: Secondary | ICD-10-CM | POA: Diagnosis present

## 2014-09-12 DIAGNOSIS — T380X5A Adverse effect of glucocorticoids and synthetic analogues, initial encounter: Secondary | ICD-10-CM | POA: Diagnosis not present

## 2014-09-12 DIAGNOSIS — Z9114 Patient's other noncompliance with medication regimen: Secondary | ICD-10-CM | POA: Diagnosis present

## 2014-09-12 DIAGNOSIS — J4531 Mild persistent asthma with (acute) exacerbation: Secondary | ICD-10-CM

## 2014-09-12 DIAGNOSIS — N189 Chronic kidney disease, unspecified: Secondary | ICD-10-CM | POA: Diagnosis present

## 2014-09-12 DIAGNOSIS — R000 Tachycardia, unspecified: Secondary | ICD-10-CM | POA: Diagnosis present

## 2014-09-12 DIAGNOSIS — R0600 Dyspnea, unspecified: Secondary | ICD-10-CM

## 2014-09-12 DIAGNOSIS — D72829 Elevated white blood cell count, unspecified: Secondary | ICD-10-CM | POA: Diagnosis present

## 2014-09-12 DIAGNOSIS — R739 Hyperglycemia, unspecified: Secondary | ICD-10-CM | POA: Diagnosis not present

## 2014-09-12 DIAGNOSIS — E669 Obesity, unspecified: Secondary | ICD-10-CM | POA: Diagnosis present

## 2014-09-12 DIAGNOSIS — R7989 Other specified abnormal findings of blood chemistry: Secondary | ICD-10-CM

## 2014-09-12 DIAGNOSIS — F17210 Nicotine dependence, cigarettes, uncomplicated: Secondary | ICD-10-CM | POA: Diagnosis present

## 2014-09-12 LAB — CBC WITH DIFFERENTIAL/PLATELET
BASOS PCT: 0 % (ref 0–1)
Basophils Absolute: 0.1 10*3/uL (ref 0.0–0.1)
Eosinophils Absolute: 0 10*3/uL (ref 0.0–0.7)
Eosinophils Relative: 0 % (ref 0–5)
HCT: 45.7 % (ref 39.0–52.0)
Hemoglobin: 15.8 g/dL (ref 13.0–17.0)
LYMPHS PCT: 5 % — AB (ref 12–46)
Lymphs Abs: 1.1 10*3/uL (ref 0.7–4.0)
MCH: 29.9 pg (ref 26.0–34.0)
MCHC: 34.6 g/dL (ref 30.0–36.0)
MCV: 86.4 fL (ref 78.0–100.0)
Monocytes Absolute: 2.1 10*3/uL — ABNORMAL HIGH (ref 0.1–1.0)
Monocytes Relative: 9 % (ref 3–12)
NEUTROS ABS: 19.2 10*3/uL — AB (ref 1.7–7.7)
Neutrophils Relative %: 86 % — ABNORMAL HIGH (ref 43–77)
Platelets: 247 10*3/uL (ref 150–400)
RBC: 5.29 MIL/uL (ref 4.22–5.81)
RDW: 13 % (ref 11.5–15.5)
WBC: 22.5 10*3/uL — AB (ref 4.0–10.5)

## 2014-09-12 LAB — COMPREHENSIVE METABOLIC PANEL
ALBUMIN: 4.3 g/dL (ref 3.5–5.2)
ALBUMIN: 4.5 g/dL (ref 3.5–5.2)
ALK PHOS: 67 U/L (ref 39–117)
ALT: 33 U/L (ref 0–53)
ALT: 33 U/L (ref 0–53)
ANION GAP: 18 — AB (ref 5–15)
AST: 52 U/L — AB (ref 0–37)
AST: 53 U/L — ABNORMAL HIGH (ref 0–37)
Alkaline Phosphatase: 59 U/L (ref 39–117)
Anion gap: 16 — ABNORMAL HIGH (ref 5–15)
BILIRUBIN TOTAL: 1.1 mg/dL (ref 0.3–1.2)
BILIRUBIN TOTAL: 0.9 mg/dL (ref 0.3–1.2)
BUN: 8 mg/dL (ref 6–23)
BUN: 9 mg/dL (ref 6–23)
CO2: 17 mmol/L — ABNORMAL LOW (ref 19–32)
CO2: 18 mmol/L — ABNORMAL LOW (ref 19–32)
Calcium: 9.4 mg/dL (ref 8.4–10.5)
Calcium: 9.4 mg/dL (ref 8.4–10.5)
Chloride: 104 mmol/L (ref 96–112)
Chloride: 101 mmol/L (ref 96–112)
Creatinine, Ser: 1.4 mg/dL — ABNORMAL HIGH (ref 0.50–1.35)
Creatinine, Ser: 1.41 mg/dL — ABNORMAL HIGH (ref 0.50–1.35)
GFR calc Af Amer: 78 mL/min — ABNORMAL LOW (ref >90)
GFR calc Af Amer: 77 mL/min — ABNORMAL LOW (ref >90)
GFR, EST NON AFRICAN AMERICAN: 67 mL/min — AB (ref >90)
GFR, EST NON AFRICAN AMERICAN: 67 mL/min — AB (ref >90)
Glucose, Bld: 94 mg/dL (ref 70–99)
Glucose, Bld: 53 mg/dL — ABNORMAL LOW (ref 70–99)
POTASSIUM: 3.9 mmol/L (ref 3.5–5.1)
Potassium: 3.8 mmol/L (ref 3.5–5.1)
SODIUM: 137 mmol/L (ref 135–145)
Sodium: 137 mmol/L (ref 135–145)
TOTAL PROTEIN: 8.1 g/dL (ref 6.0–8.3)
Total Protein: 7.7 g/dL (ref 6.0–8.3)

## 2014-09-12 LAB — PHOSPHORUS: Phosphorus: 1.1 mg/dL — ABNORMAL LOW (ref 2.3–4.6)

## 2014-09-12 LAB — I-STAT CG4 LACTIC ACID, ED: LACTIC ACID, VENOUS: 3.22 mmol/L — AB (ref 0.5–2.0)

## 2014-09-12 LAB — STREP PNEUMONIAE URINARY ANTIGEN: Strep Pneumo Urinary Antigen: NEGATIVE

## 2014-09-12 LAB — MRSA PCR SCREENING: MRSA BY PCR: NEGATIVE

## 2014-09-12 LAB — RAPID URINE DRUG SCREEN, HOSP PERFORMED
Amphetamines: NOT DETECTED
BARBITURATES: NOT DETECTED
BENZODIAZEPINES: NOT DETECTED
COCAINE: NOT DETECTED
Opiates: NOT DETECTED
TETRAHYDROCANNABINOL: POSITIVE — AB

## 2014-09-12 LAB — LACTIC ACID, PLASMA
Lactic Acid, Venous: 5 mmol/L (ref 0.5–2.0)
Lactic Acid, Venous: 1.7 mmol/L (ref 0.5–2.0)

## 2014-09-12 LAB — MAGNESIUM: Magnesium: 1.7 mg/dL (ref 1.5–2.5)

## 2014-09-12 MED ORDER — ONDANSETRON HCL 4 MG/2ML IJ SOLN: 4.0000 mg | Freq: Four times a day (QID) | INTRAMUSCULAR | Status: DC | PRN

## 2014-09-12 MED ORDER — ACETAMINOPHEN 325 MG PO TABS
650.0000 mg | ORAL_TABLET | Freq: Once | ORAL | Status: AC
  Administered 2014-09-12: 650 mg via ORAL
  Filled 2014-09-12: qty 2

## 2014-09-12 MED ORDER — METHYLPREDNISOLONE SODIUM SUCC 125 MG IJ SOLR
60.0000 mg | Freq: Four times a day (QID) | INTRAMUSCULAR | Status: DC
  Administered 2014-09-12 – 2014-09-13 (×3): 60 mg via INTRAVENOUS
  Filled 2014-09-12: qty 0.96
  Filled 2014-09-12: qty 2
  Filled 2014-09-12 (×2): qty 0.96
  Filled 2014-09-12: qty 2
  Filled 2014-09-12 (×2): qty 0.96

## 2014-09-12 MED ORDER — HEPARIN SODIUM (PORCINE) 5000 UNIT/ML IJ SOLN
5000.0000 [IU] | Freq: Three times a day (TID) | INTRAMUSCULAR | Status: DC
  Administered 2014-09-12 – 2014-09-13 (×3): 5000 [IU] via SUBCUTANEOUS
  Filled 2014-09-12 (×4): qty 1

## 2014-09-12 MED ORDER — PANTOPRAZOLE SODIUM 40 MG PO TBEC
40.0000 mg | DELAYED_RELEASE_TABLET | Freq: Every day | ORAL | Status: DC
  Administered 2014-09-12 – 2014-09-13 (×2): 40 mg via ORAL
  Filled 2014-09-12 (×2): qty 1

## 2014-09-12 MED ORDER — ALBUTEROL SULFATE (2.5 MG/3ML) 0.083% IN NEBU: 2.5000 mg | INHALATION_SOLUTION | RESPIRATORY_TRACT | Status: DC | PRN

## 2014-09-12 MED ORDER — IPRATROPIUM-ALBUTEROL 0.5-2.5 (3) MG/3ML IN SOLN
3.0000 mL | RESPIRATORY_TRACT | Status: DC
  Administered 2014-09-12 – 2014-09-13 (×6): 3 mL via RESPIRATORY_TRACT
  Filled 2014-09-12 (×6): qty 3

## 2014-09-12 MED ORDER — SODIUM CHLORIDE 0.9 % IV SOLN
1000.0000 mL | Freq: Once | INTRAVENOUS | Status: AC
  Administered 2014-09-12: 1000 mL via INTRAVENOUS

## 2014-09-12 MED ORDER — ALBUTEROL (5 MG/ML) CONTINUOUS INHALATION SOLN
10.0000 mg/h | INHALATION_SOLUTION | Freq: Once | RESPIRATORY_TRACT | Status: AC
  Administered 2014-09-12: 10 mg/h via RESPIRATORY_TRACT
  Filled 2014-09-12: qty 20

## 2014-09-12 MED ORDER — METHYLPREDNISOLONE SODIUM SUCC 125 MG IJ SOLR
125.0000 mg | Freq: Once | INTRAMUSCULAR | Status: AC
  Administered 2014-09-12: 125 mg via INTRAVENOUS
  Filled 2014-09-12: qty 2

## 2014-09-12 MED ORDER — LEVOFLOXACIN IN D5W 750 MG/150ML IV SOLN
750.0000 mg | INTRAVENOUS | Status: DC
  Administered 2014-09-12: 750 mg via INTRAVENOUS
  Filled 2014-09-12 (×2): qty 150

## 2014-09-12 MED ORDER — SODIUM CHLORIDE 0.9 % IV SOLN: 250.0000 mL | INTRAVENOUS | Status: DC | PRN

## 2014-09-12 MED ORDER — SODIUM CHLORIDE 0.9 % IV BOLUS (SEPSIS): 500.0000 mL | INTRAVENOUS | Status: AC

## 2014-09-12 MED ORDER — SODIUM CHLORIDE 0.9 % IV BOLUS (SEPSIS)
1000.0000 mL | INTRAVENOUS | Status: AC
  Administered 2014-09-12: 1000 mL via INTRAVENOUS

## 2014-09-12 MED ORDER — SODIUM CHLORIDE 0.9 % IV SOLN
1000.0000 mL | INTRAVENOUS | Status: DC
  Administered 2014-09-12 – 2014-09-13 (×3): 1000 mL via INTRAVENOUS

## 2014-09-12 MED ORDER — ALBUTEROL (5 MG/ML) CONTINUOUS INHALATION SOLN
INHALATION_SOLUTION | RESPIRATORY_TRACT | Status: AC
  Administered 2014-09-12: 10 mg/h via RESPIRATORY_TRACT
  Filled 2014-09-12: qty 20

## 2014-09-12 NOTE — ED Notes (Signed)
Pt undressed, in gown, on monitor, continuous pulse oximetry and blood pressure cuff; visitor at bedside 

## 2014-09-12 NOTE — H&P (Signed)
Name: Harold Morales MRN: [id number] DOB: 1986-02-15    ADMISSION DATE:  09/12/2014 CONSULTATION DATE:  4/17  REFERRING MD :  Rosalia Hammers  CHIEF COMPLAINT:  Cough and acute respiratory distress.   BRIEF PATIENT DESCRIPTION:  29 year old male who carries a dx of asthma since childhood. Active smoker. Admitted on 4/17 w/ asthmatic exacerbation   SIGNIFICANT EVENTS   STUDIES:   HISTORY OF PRESENT ILLNESS:   29 year old male who carries a dx of asthma since childhood. Active smoker. Works at [employer]. Not on medication d/t financial restraints, but asthma limits day to day activity and at times he borrows a friends albuterol when he is really short of breath. He reports he has been in his usual state of health until the pm hours of 4/16 when he suddenly developed acute cough. Cough was non-productive; but persistent. He endorsed chills, no chest pain, no sick exposures, no HA, sinus gtt, PND, sore throat, did have wheeze. Didn't sleep well due to shortness of breath. Presented to the ER 4/17, lactic acid 3.22. Treatment to date: O2, CAB, IV steroids. PCCM asked to assess for possible admission   PAST MEDICAL HISTORY :   has a past medical history of Asthma and Eczema.  has past surgical history that includes Appendectomy. Prior to Admission medications   Medication Sig Start Date End Date Taking? Authorizing Provider  ondansetron (ZOFRAN ODT) 4 MG disintegrating tablet Take 1 tablet (4 mg total) by mouth every 8 (eight) hours as needed for nausea. 10/28/13   Jillyn Ledger, PA-C   Allergies  Allergen Reactions  . Shrimp [Shellfish Allergy] Anaphylaxis    Throat swelling    FAMILY HISTORY:  family history is not on file. SOCIAL HISTORY:  reports that he has been smoking Cigarettes.  He has been smoking about 0.50 packs per day. He does not have any smokeless tobacco history on file. He reports that he drinks alcohol. He reports that he does not use illicit drugs.  REVIEW OF  SYSTEMS:   Constitutional: Negative for fever, chills, weight loss, malaise/fatigue and diaphoresis.  HENT: Negative for hearing loss, ear pain, nosebleeds, congestion, sore throat, neck pain, tinnitus and ear discharge.   Eyes: Negative for blurred vision, double vision, photophobia, pain, discharge and redness.  Respiratory: + cough, non-productive, hemoptysis, shortness of breath, wheezing and stridor.   Cardiovascular: Negative for chest pain, palpitations, orthopnea, claudication, leg swelling and PND.  Gastrointestinal: Negative for heartburn, nausea, vomiting, abdominal pain, diarrhea, constipation, blood in stool and melena.  Genitourinary: Negative for dysuria, urgency, frequency, hematuria and flank pain.  Musculoskeletal: Negative for myalgias, back pain, joint pain and falls.  Skin: Negative for itching and rash.  Neurological: Negative for dizziness, tingling, tremors, sensory change, speech change, focal weakness, seizures, loss of consciousness, weakness and headaches.  Endo/Heme/Allergies: Negative for environmental allergies and polydipsia. Does not bruise/bleed easily.  SUBJECTIVE:  Feels a little better  VITAL SIGNS: Temp:  [99.8 F (37.7 C)] 99.8 F (37.7 C) (04/17 0944) Pulse Rate:  [128-138] 134 (04/17 1030) Resp:  [21-26] 21 (04/17 1030) BP: (106-144)/(41-128) 127/77 mmHg (04/17 1030) SpO2:  [95 %-100 %] 100 % (04/17 1030) Weight:  [116.121 kg (256 lb)] 116.121 kg (256 lb) (04/17 1059)  PHYSICAL EXAMINATION: General:  Well developed 29 year old AAM, currently resting comfortably Neuro:  Awake, alert  HEENT:  , AT. No JVD. Phonation hoarse  Cardiovascular:  Rrr, tachy  Lungs:  Good air movement. No wheeze presently  Abdomen:  Soft, non-tender + bowel sounds  Musculoskeletal:  Intact  Skin:  Intact   ABG No results found for: PHART, PCO2ART, PO2ART, HCO3, TCO2, ACIDBASEDEF, O2SAT   No results for input(s): NA, K, CL, CO2, BUN, CREATININE, GLUCOSE in the  last 168 hours.  Recent Labs Lab 09/12/14 0954  HGB 15.8  HCT 45.7  WBC 22.5*  PLT 247   Dg Chest Port 1 View  09/12/2014   CLINICAL DATA:  29 year old male with a history of shortness of breath and wheezing. History of smoking and asthma.  EXAM: PORTABLE CHEST - 1 VIEW  COMPARISON:  10/28/2013, 06/24/2012  FINDINGS: Cardiomediastinal silhouette unchanged in size and contour. No evidence of pulmonary vascular congestion.  No confluent airspace disease, pneumothorax, or pleural effusion.  No acute fracture.  Unremarkable appearance of the upper abdomen.  IMPRESSION: No radiographic evidence of acute cardiopulmonary disease.  Signed,  Yvone NeuJaime S. Loreta AveWagner, DO  [facility]  [facility]   Electronically Signed   By: Gilmer MorJaime  Wagner D.O.   On: 09/12/2014 10:19    ASSESSMENT / PLAN: Asthmatic exacerbation  Lactic acidosis due to above Acute bronchitis  SIRS Sinus tach Tobacco abuse Obesity  Discussion 29 year old male presenting w/ asthmatic exacerbation in the setting of acute bronchitis (likely). Responding to rx thus far in the ER. Needs admission. Suspect that lactic acid r/t asthma  Plan Admit to SDU Change Cont alb to scheduled Wean FIO2 Systemic steroids 30 ml/kh fluid challenge w/ f/u lactic acid Mono-therapy w/ levaquin Smoking cessation F/u admit labs pending    Simonne MartinetPeter E Babcock ACNP-BC [facility] Pager # [phone] OR # [phone] if no answer   09/12/2014, 11:01 AM   STAFF NOTE: I, Rory Percyaniel Feinstein, MD FACP have personally reviewed patient's available data, including medical history, events of note, physical examination and test results as part of my evaluation. I have discussed with resident/NP and other care providers such as pharmacist, RN and RRT. In addition, I personally evaluated patient and elicited key findings of: Improving wheezing / ronhci with BD'ers, rr improved, WOB improved, fevers prior per  history, pcxr re assuring without infiltrate, likely bronchitis in setting asthma exacerbation, would provide atypical infection coverage, given LA (likley mostly from asthma) would give 30 cc/kg bolus, repeat la, ensure [facility], admit to hospital sdu, ensure LA resolution, change to q2-4 hr BSers, add steroids, updated pt, d/w EDP  Mcarthur Rossettianiel J. Tyson AliasFeinstein, MD, FACP Pgr: [phone] [facility] 09/12/2014 11:21 AM

## 2014-09-12 NOTE — Progress Notes (Addendum)
09/12/2014 2:59 PM Lactic Acid called to E-Link Nurse Noreene LarssonJill to given to MD  Results 5.0  Harold Morales 1600  Dr. Cherly BeachSomers aware and orders received to repeat at 2200. [name], [name]

## 2014-09-12 NOTE — ED Provider Notes (Signed)
CSN: [id number]     Arrival date & time 09/12/14  4098 History   First MD Initiated Contact with Patient 09/12/14 [phone]     Chief Complaint  Patient presents with  . Shortness of Breath     (Consider location/radiation/quality/duration/timing/severity/associated sxs/prior Treatment) HPI 29 year old man history of asthma presents today complaining of cough and fever that began yesterday.  Patient states well until yesterday afternoon when he began having some subjective fever, chills, cough and increasing dyspnea. He does not currently have any asthma medications at home. He used over-the-counter allergy medications. Did not take his temperature he has had decreased by mouth intake. He is a smoker but has been unable to smoke since his symptoms began. He denies headache, visual changes, neck pain, chest pain, vomiting, or diarrhea. He was hospitalized as a child for asthma but has not been as an adult. Past Medical History  Diagnosis Date  . Asthma   . Eczema    Past Surgical History  Procedure Laterality Date  . Appendectomy     History reviewed. No pertinent family history. History  Substance Use Topics  . Smoking status: Current Every Day Smoker -- 0.50 packs/day    Types: Cigarettes  . Smokeless tobacco: Not on file  . Alcohol Use: Yes     Comment: occasionally    Review of Systems  All other systems reviewed and are negative.     Allergies  Shrimp  Home Medications   Prior to Admission medications   Medication Sig Start Date End Date Taking? Authorizing Provider  diphenhydrAMINE (SOMINEX) 25 MG tablet Take 50 mg by mouth daily as needed for allergies.   Yes Historical Provider, MD  triamcinolone cream (KENALOG) 0.1 % Apply 1 application topically 2 (two) times daily. Apply to arms, legs for eczema   Yes Historical Provider, MD  ondansetron (ZOFRAN ODT) 4 MG disintegrating tablet Take 1 tablet (4 mg total) by mouth every 8 (eight) hours as needed for nausea. Patient  not taking: Reported on 09/12/2014 10/28/13   Jillyn Ledger, PA-C   BP 127/77 mmHg  Pulse 134  Temp(Src) 99.8 F (37.7 C) (Oral)  Resp 21  Wt 256 lb (116.121 kg)  SpO2 100% Physical Exam  Constitutional: He is oriented to person, place, and time. He appears well-developed and well-nourished. He appears distressed.  HENT:  Head: Normocephalic and atraumatic.  Right Ear: External ear normal.  Left Ear: External ear normal.  Nose: Nose normal.  Mouth/Throat: Oropharynx is clear and moist.  Eyes: Conjunctivae and EOM are normal. Pupils are equal, round, and reactive to light.  Neck: Normal range of motion. Neck supple.  Cardiovascular: Regular rhythm, normal heart sounds and intact distal pulses.  Tachycardia present.   Pulmonary/Chest: Tachypnea noted. He is in respiratory distress. He has wheezes. He has rales. He exhibits no tenderness.  Diffuse rhonchi and coarse expiratory wheezing. Air movement appears to be decreased.  Abdominal: Soft. Bowel sounds are normal. He exhibits no distension and no mass. There is no tenderness. There is no guarding.  Musculoskeletal: Normal range of motion.  Neurological: He is alert and oriented to person, place, and time. He has normal reflexes. He exhibits normal muscle tone. Coordination normal.  Skin: Skin is warm and dry.  Psychiatric: He has a normal mood and affect. His behavior is normal. Judgment and thought content normal.  Nursing note and vitals reviewed.   ED Course  Procedures (including critical care time) Labs Review Labs Reviewed  CBC WITH DIFFERENTIAL/PLATELET -  Abnormal; Notable for the following:    WBC 22.5 (*)    Neutrophils Relative % 86 (*)    Neutro Abs 19.2 (*)    Lymphocytes Relative 5 (*)    Monocytes Absolute 2.1 (*)    All other components within normal limits  I-STAT CG4 LACTIC ACID, ED - Abnormal; Notable for the following:    Lactic Acid, Venous 3.22 (*)    All other components within normal limits  CULTURE,  BLOOD (ROUTINE X 2)  CULTURE, BLOOD (ROUTINE X 2)  COMPREHENSIVE METABOLIC PANEL  CBC  COMPREHENSIVE METABOLIC PANEL  MAGNESIUM  PHOSPHORUS  PROCALCITONIN  STREP PNEUMONIAE URINARY ANTIGEN  URINE RAPID DRUG SCREEN (HOSP PERFORMED)  I-STAT CG4 LACTIC ACID, ED    Imaging Review Dg Chest Port 1 View  09/12/2014   CLINICAL DATA:  29 year old male with a history of shortness of breath and wheezing. History of smoking and asthma.  EXAM: PORTABLE CHEST - 1 VIEW  COMPARISON:  10/28/2013, 06/24/2012  FINDINGS: Cardiomediastinal silhouette unchanged in size and contour. No evidence of pulmonary vascular congestion.  No confluent airspace disease, pneumothorax, or pleural effusion.  No acute fracture.  Unremarkable appearance of the upper abdomen.  IMPRESSION: No radiographic evidence of acute cardiopulmonary disease.  Signed,  Yvone NeuJaime S. Loreta AveWagner, DO  [facility]  [facility]   Electronically Signed   By: Gilmer MorJaime  Wagner D.O.   On: 09/12/2014 10:19     EKG Interpretation None      MDM   Final diagnoses:  Elevated lactic acid level  Dyspnea    29 year old man with subjective fever, tachycardia, and wheezing. Patient improved here with albuterol nebulizer and Solu-Medrol. He is receiving a IV fluid bolus. Initial lactic acid elevated at 3.22. Critical care consulted secondary to this and Dr. Tyson AliasFeinstein has seen and evaluated the patient and is admitting him to the hospital.    Margarita Grizzleanielle Kamela Blansett, MD 09/12/14 1118

## 2014-09-12 NOTE — Progress Notes (Signed)
Peak flow performed times 3....Marland Kitchen. 161,[phone],Marland [phone] with good pt effort.  Pt instructed on use and importance of monitoring PF with his asthma.  Pt understands.  RT will continue to monitor.

## 2014-09-12 NOTE — ED Notes (Signed)
Lab results given to Dr.Ray. 

## 2014-09-12 NOTE — ED Notes (Signed)
Pt here for cough, fever, chills and SOB. Hx of asthma. Slight wheezing and rhonchi noted. Pt speaking in short sentences. sts started yesterday around 5 pm.

## 2014-09-13 ENCOUNTER — Inpatient Hospital Stay (HOSPITAL_COMMUNITY): Payer: Self-pay

## 2014-09-13 DIAGNOSIS — J4541 Moderate persistent asthma with (acute) exacerbation: Principal | ICD-10-CM

## 2014-09-13 LAB — CBC
HEMATOCRIT: 41.7 % (ref 39.0–52.0)
HEMOGLOBIN: 14.5 g/dL (ref 13.0–17.0)
MCH: 29.8 pg (ref 26.0–34.0)
MCHC: 34.8 g/dL (ref 30.0–36.0)
MCV: 85.8 fL (ref 78.0–100.0)
Platelets: 245 10*3/uL (ref 150–400)
RBC: 4.86 MIL/uL (ref 4.22–5.81)
RDW: 13.4 % (ref 11.5–15.5)
WBC: 23.5 10*3/uL — ABNORMAL HIGH (ref 4.0–10.5)

## 2014-09-13 LAB — BASIC METABOLIC PANEL
ANION GAP: 11 (ref 5–15)
BUN: 9 mg/dL (ref 6–23)
CALCIUM: 8.9 mg/dL (ref 8.4–10.5)
CO2: 18 mmol/L — ABNORMAL LOW (ref 19–32)
CREATININE: 1.01 mg/dL (ref 0.50–1.35)
Chloride: 108 mmol/L (ref 96–112)
GFR calc Af Amer: >90 mL/min (ref >90)
GFR calc non Af Amer: >90 mL/min (ref >90)
GLUCOSE: 140 mg/dL — AB (ref 70–99)
Potassium: 4.9 mmol/L (ref 3.5–5.1)
SODIUM: 137 mmol/L (ref 135–145)

## 2014-09-13 MED ORDER — BECLOMETHASONE DIPROPIONATE 40 MCG/ACT IN AERS: 1.0000 | INHALATION_SPRAY | Freq: Two times a day (BID) | RESPIRATORY_TRACT | Status: DC

## 2014-09-13 MED ORDER — PREDNISONE 20 MG PO TABS: ORAL_TABLET | ORAL | Status: DC

## 2014-09-13 MED ORDER — BECLOMETHASONE DIPROPIONATE 40 MCG/ACT IN AERS: 2.0000 | INHALATION_SPRAY | Freq: Two times a day (BID) | RESPIRATORY_TRACT | Status: DC

## 2014-09-13 MED ORDER — LEVOFLOXACIN 750 MG PO TABS
750.0000 mg | ORAL_TABLET | Freq: Every day | ORAL | Status: DC
  Administered 2014-09-13: 750 mg via ORAL
  Filled 2014-09-13: qty 1

## 2014-09-13 MED ORDER — BECLOMETHASONE DIPROPIONATE 40 MCG/ACT IN AERS
2.0000 | INHALATION_SPRAY | Freq: Two times a day (BID) | RESPIRATORY_TRACT | Status: DC
  Administered 2014-09-13: 2 via RESPIRATORY_TRACT
  Filled 2014-09-13: qty 8.7

## 2014-09-13 MED ORDER — DOXYCYCLINE HYCLATE 50 MG PO CAPS: 100.0000 mg | ORAL_CAPSULE | Freq: Two times a day (BID) | ORAL | Status: DC

## 2014-09-13 MED ORDER — IPRATROPIUM-ALBUTEROL 0.5-2.5 (3) MG/3ML IN SOLN
3.0000 mL | Freq: Four times a day (QID) | RESPIRATORY_TRACT | Status: DC
  Administered 2014-09-13: 3 mL via RESPIRATORY_TRACT
  Filled 2014-09-13 (×2): qty 3

## 2014-09-13 NOTE — Progress Notes (Addendum)
PULMONARY  / CRITICAL CARE MEDICINE  Name: Harold Morales MRN: [id number] DOB: 1986-03-01    LOS: 1  REFERRING MD :  EDP  CHIEF COMPLAINT:  Acute asthma eacerbation  BRIEF PATIENT DESCRIPTION: 29 year old male w/ PMH asthma since childhood, medication noncompliance, and tobacco abuse, admitted on 4/17 w/ asthmatic exacerbation.  LINES / TUBES: None  CULTURES: 4/17 Bcx x2 >>>  ANTIBIOTICS: Levaquin 4/17 >>>  SIGNIFICANT EVENTS:  4/17 admitted with acute asthma exacerbation  LEVEL OF CARE: SDU PRIMARY SERVICE:  PCCM CONSULTANTS:  None CODE STATUS: FULL DIET:  Regular DVT Px:  [place] heparin GI Px:  Protonix  HISTORY OF PRESENT ILLNESS:  29 year old male who carries a dx of asthma since childhood. Active smoker. Admitted on 4/17 w/ asthmatic exacerbation    INTERVAL HISTORY:   VITAL SIGNS: Temp:  [97.8 F (36.6 C)-99.8 F (37.7 C)] 98.2 F (36.8 C) (04/18 0759) Pulse Rate:  [79-150] 92 (04/18 0423) Resp:  [15-41] 18 (04/18 0759) BP: (91-170)/(32-128) 162/66 mmHg (04/18 0759) SpO2:  [95 %-100 %] 97 % (04/18 0759) Weight:  [244 lb 0.8 oz (110.7 kg)-256 lb (116.121 kg)] 244 lb 0.8 oz (110.7 kg) (04/18 0425) HEMODYNAMICS:   VENTILATOR SETTINGS:   INTAKE / OUTPUT: Intake/Output      04/17 0701 - 04/18 0700 04/18 0701 - 04/19 0700   P.O. 1680    I.V. (mL/kg) 3479.9 (31.4)    IV Piggyback 3150    Total Intake(mL/kg) 8309.9 (75.1)    Urine (mL/kg/hr) 4300    Total Output 4300     Net +4009.9            PHYSICAL EXAMINATION: General:  Lying in bed, NAD Neuro: A&Ox3 HEENT:  NCAT, EOMI Cardiovascular:  RRR Lungs:  CTAB Abdomen:  S, NT, obese Musculoskeletal:  Moves all 4 ext Skin:  No appreciable rashed   LABS: Cbc  Recent Labs Lab 09/12/14 0954 09/13/14 0209  WBC 22.5* 23.5*  HGB 15.8 14.5  HCT 45.7 41.7  PLT 247 245    Chemistry   Recent Labs Lab 09/12/14 0954 09/13/14 0209  NA 137  137 137  K 3.9  3.8 4.9  CL 101  104 108  CO2 18*   17* 18*  BUN CREATININE 1.41*  1.40* 1.01  CALCIUM 9.4  9.4 8.9  MG 1.7  --   PHOS 1.1*  --   GLUCOSE 53*  94 140*    Liver fxn  Recent Labs Lab 09/12/14 0954  AST 53*  52*  ALT 33  33  ALKPHOS 67  59  BILITOT 0.9  1.1  PROT 8.1  7.7  ALBUMIN 4.5  4.3   coags No results for input(s): APTT, INR in the last 168 hours. Sepsis markers  Recent Labs Lab 09/12/14 1002 09/12/14 1345 09/12/14 2202  LATICACIDVEN 3.22* 5.0* 1.7   Cardiac markers No results for input(s): CKTOTAL, CKMB, TROPONINI in the last 168 hours. BNP No results for input(s): PROBNP in the last 168 hours. ABG No results for input(s): PHART, PCO2ART, PO2ART, HCO3, TCO2 in the last 168 hours.  CBG trend No results for input(s): GLUCAP in the last 168 hours.  IMAGING: 4/17: CXR with no acute cardiopulmonary abnormalities 4/18: CXR with no acute cardiopulmonary abnormalities   DIAGNOSES:  ASSESSMENT / PLAN:  PULMONARY  ASSESSMENT: Asthma exacerbation in setting of medication noncompliance Tobacco abuse PLAN:   Continue steroids, transition to po prednisone with taper starting tomorrow Levaquin  x5 days --> transition to Doxycycline due to cost Duonebs q4h --> transition to PRN  CARDIOVASCULAR  ASSESSMENT:  Elevated BP this morning  PLAN: May need to begin daily medication  RENAL  ASSESSMENT:  Possible CKD vs AKI, improved renal function PLAN:   BMP in am  GASTROINTESTINAL  ASSESSMENT:   No issues PLAN:   Regular diet PPI  HEMATOLOGIC  ASSESSMENT:  Leukocytosis in setting of steroid use and asthma exaerbation  PLAN:  No intervention at this time  INFECTIOUS  ASSESSMENT: ?bronchitis vs asthma exacerbation  PLAN:   Levaquin x2 days, transition to Doxycycline for 5 days total as noted above.   ENDOCRINE  ASSESSMENT:   Mild hyperglycemia   PLAN:   CBG checks  NEUROLOGIC  ASSESSMENT:   No issues   CLINICAL SUMMARY: 28yo m w/ PMH asthma  and ongoing tobacco abuse presented with asthma exacerbation, that has significantly improved with steroids and breathing treatments, and the patient is now appropriate for discharge home.     Genelle GatherGlenn, Kathryn F, MD Internal Medicine Resident, PGY III [facility] 09/13/2014 8:21 AM   PCCM ATTENDING: I have reviewed pt's initial presentation, consultants notes and hospital database in detail.  The above assessment and plan was formulated under my direction.  In summary: Acute asthma exac markedly improved Likely moderate persistent asthma Smoker  He is ready for discharge home. I have counseled in detail re: asthma mgmt and need for controller medication and smoking cessation. He seems motivated to take control of this problem. He has been limited by lack of funds for follow up care and medications and by lack of insight as evidenced by continued smoking   Billy Fischeravid Simonds, MD;  PCCM service; Mobile [phone]

## 2014-09-13 NOTE — Progress Notes (Signed)
CSW (Clinical Child psychotherapistocial Worker) received consult and call from nurse regarding pt medication assistance. CSW notified pt RN that [facility] with [facility]. Pt nurse to speak with RNCM. Pt had no identified hospital social work needs.  Harold Morales, LCSWA [phone]

## 2014-09-13 NOTE — Discharge Summary (Signed)
Physician Discharge Summary       Patient ID: Harold PiperWilliam Morales MRN: [id number] DOB/AGE: Apr 24, 1986 29 y.o.  Admit date: 09/12/2014 Discharge date: 09/13/2014  Discharge Diagnoses:  Acute asthma exacerbation Tobacco abuse AKI vs CKD  Detailed Hospital Course:  29yo M w/ PMH asthma and tobacco abuse (3ppwk x~10 ys) was admitted 4/17 with an acute asthma exacerbation. He was started on IV steroids and antibiotics and was admitted to SDU on supplemental O2. His O2 requirement decreased overnight, and by the morning his lungs were clear to ausculation and the patient was without SOB or DOE. His blood pressure was initially low normal on admission, but was more elevated in the morning prior to discharge. He was also noted to have a possible AKI vs CKD; renal function did improve prior to discharge. He was determined to be appropriate for discharge home. At discharge, he was started on [place] and continued on a steroid taper and antibiotics.   Discharge Plan by active problems   Acute asthma exacerbation in the setting of current tobacco abuse and medication noncompliance: - Transitioning from Levaquin to Doxycycline for a total of 5 days, which the patient should be able to afford. - Starting QVAR 40mcg 2 puffs BID, up-titrate as needed - Prednisone taper: 60mg  x3 days, then 40mg  x3 days, then 20mg  x3 days - F/u with [facility] as an outpatient  Tobacco abuse: - Please assess cessation from cigarettes, cessation was discussed in detail with the patient prior to discharge home.  - Would likely benefit from outpatient assistance.   Elevated blood pressure: Monitor BP as an outpatient.   AKI vs CKD: Check BMP at hospital f/u    Significant Hospital tests/ studies 4/17 and 4/18 CXR without cardiopulmonary abnormalities.   Consults  None   Discharge Exam: BP 140/73 mmHg  Pulse 87  Temp(Src) 98.2 F (36.8 C) (Oral)  Resp 18  Ht 5\' 9"  (1.753 m)  Wt 244 lb 0.8 oz (110.7  kg)  BMI 36.02 kg/m2  SpO2 96% Vitals reviewed. General: Sitting up in a chair, NAD HEENT: NCAT, EOMI Cardiac: RRR, no rubs, murmurs or gallops Pulm: Clear to auscultation bilaterally, no wheezes, rales, or rhonchi Abd: Soft, nontender, nondistended Ext: Warm and well perfused Neuro: Alert and oriented X3, cranial nerves II-XII grossly intact, moves all 4 extremities   Labs at discharge Lab Results  Component Value Date   CREATININE 1.01 09/13/2014   BUN 9 09/13/2014   NA 137 09/13/2014   K 4.9 09/13/2014   CL 108 09/13/2014   CO2 18* 09/13/2014   Lab Results  Component Value Date   WBC 23.5* 09/13/2014   HGB 14.5 09/13/2014   HCT 41.7 09/13/2014   MCV 85.8 09/13/2014   PLT 245 09/13/2014   Lab Results  Component Value Date   ALT 33 09/12/2014   ALT 33 09/12/2014   AST 52* 09/12/2014   AST 53* 09/12/2014   ALKPHOS 59 09/12/2014   ALKPHOS 67 09/12/2014   BILITOT 1.1 09/12/2014   BILITOT 0.9 09/12/2014   No results found for: INR, PROTIME  Current radiology studies Dg Chest Port 1 View  09/13/2014   CLINICAL DATA:  Pneumonia, chest pain  EXAM: PORTABLE CHEST - 1 VIEW  COMPARISON:  Portable chest x-ray of September 12, 2014  FINDINGS: The lungs are adequately inflated and clear. The heart and pulmonary vascularity are normal. The mediastinum is normal in width. There is no pleural effusion or pneumothorax. The bony thorax exhibits no acute abnormalities.  IMPRESSION: There is no active cardiopulmonary disease.   Electronically Signed   By: Tung Pustejovsky  [place]   On: 09/13/2014 07:20   Dg Chest Port 1 View  09/12/2014   CLINICAL DATA:  29 year old male with a history of shortness of breath and wheezing. History of smoking and asthma.  EXAM: PORTABLE CHEST - 1 VIEW  COMPARISON:  10/28/2013, 06/24/2012  FINDINGS: Cardiomediastinal silhouette unchanged in size and contour. No evidence of pulmonary vascular congestion.  No confluent airspace disease, pneumothorax, or pleural effusion.   No acute fracture.  Unremarkable appearance of the upper abdomen.  IMPRESSION: No radiographic evidence of acute cardiopulmonary disease.  Signed,  Yvone Neu. Loreta Ave, DO  [facility]  [facility]   Electronically Signed   By: Gilmer Mor D.O.   On: 09/12/2014 10:19    Disposition:  01-Home or Self Care      Discharge Instructions    Call MD for:  difficulty breathing, headache or visual disturbances    Complete by:  As directed      Call MD for:  extreme fatigue    Complete by:  As directed      Call MD for:  persistant dizziness or light-headedness    Complete by:  As directed      Diet general    Complete by:  As directed      Discharge instructions    Complete by:  As directed   Begin taking the Prednisone tomorrow (4/19) as noted below: Take  (3 tablets) for 3 days, then  (2 tablets) for 3 days, then  (1 tablet) for 3 days, then stop  Begin taking Doxycycline 2 tablets daily for 3 days, starting tomorrow  Begin using QVAR inhaler 1 puff 2 times a day  Please work on abstaining from cigarette use!  Be sure to follow up with [facility] on 09/19/14 at 4:15pm  Please be sure to establish care with a primary care physician.     Increase activity slowly    Complete by:  As directed             Medication List    STOP taking these medications        ondansetron 4 MG disintegrating tablet  Commonly known as:  ZOFRAN ODT      TAKE these medications        beclomethasone 40 MCG/ACT inhaler  Commonly known as:  QVAR  Inhale 2 puffs into the lungs 2 (two) times daily.     diphenhydrAMINE 25 MG tablet  Commonly known as:  SOMINEX  Take 50 mg by mouth daily as needed for allergies.     doxycycline 50 MG capsule  Commonly known as:  VIBRAMYCIN  Take 2 capsules (100 mg total) by mouth 2 (two) times daily.  Start taking on:  09/14/2014     predniSONE 20 MG tablet  Commonly known as:  DELTASONE  Take  as indicated in your discharge instructions     triamcinolone cream 0.1 %  Commonly known as:  KENALOG  Apply 1 application topically 2 (two) times daily. Apply to arms, legs for eczema       Follow-up Information    Follow up with PARRETT,TAMMY, NP On 09/23/2014.   Specialty:  Nurse Practitioner   Why:  Your appointment is at 4;15pm. Please be 20 minutes early.    Contact information:   [address] [place] [address] [phone]       Follow up  with [facility]    . Schedule an appointment as soon as possible for a visit in 4 days.   Contact information:   [address] [place] [address] [phone]      Discharged Condition: good   Signed: Genelle Gather, MD Internal Medicine Resident, PGY III [facility] 09/13/2014 1:46 PM   Billy Fischer, MD ; [facility] [phone].  After 5:30 PM or weekends, call [phone]

## 2014-09-13 NOTE — Care Management Note (Signed)
    Page 1 of 1   09/13/2014     12:25:15 PM CARE MANAGEMENT NOTE 09/13/2014  Patient:  Michelle PiperBRYANT,Deondrae   Account Number:  [id number]  Date Initiated:  09/13/2014  Documentation initiated by:  Junius CreamerWELL,DEBBIE  Subjective/Objective Assessment:   adm w asthma     Action/Plan:   lives w fam, no ins   Anticipated DC Date:  09/13/2014   Anticipated DC Plan:  HOME/SELF CARE         Choice offered to / List presented to:             Status of service:   Medicare Important Message given?   (If response is "NO", the following Medicare IM given date fields will be blank) Date Medicare IM given:   Medicare IM given by:   Date Additional Medicare IM given:   Additional Medicare IM given by:    Discharge Disposition:    Per UR Regulation:    If discussed at Long Length of Stay Meetings, dates discussed:    Comments:  4/18 1223p debbie Tailor [place]fall rn,bsn spoke w pt. he does not have ins or pcp. gave him inform on [facility] and enc him to go there mon thru frid 9a to 4pm to get md appt, getmeds filled, get organge card. he states will do this.

## 2014-09-18 LAB — CULTURE, BLOOD (ROUTINE X 2)
CULTURE: NO GROWTH
Culture: NO GROWTH

## 2014-09-21 ENCOUNTER — Ambulatory Visit: Payer: Self-pay | Attending: Family Medicine | Admitting: Family Medicine

## 2014-09-21 ENCOUNTER — Encounter: Payer: Self-pay | Admitting: Family Medicine

## 2014-09-21 VITALS — BP 143/84 | HR 80 | Temp 98.4°F | Resp 16 | Ht 69.0 in | Wt 246.0 lb

## 2014-09-21 DIAGNOSIS — F17210 Nicotine dependence, cigarettes, uncomplicated: Secondary | ICD-10-CM | POA: Insufficient documentation

## 2014-09-21 DIAGNOSIS — R030 Elevated blood-pressure reading, without diagnosis of hypertension: Secondary | ICD-10-CM | POA: Insufficient documentation

## 2014-09-21 DIAGNOSIS — IMO0001 Reserved for inherently not codable concepts without codable children: Secondary | ICD-10-CM | POA: Insufficient documentation

## 2014-09-21 DIAGNOSIS — J4541 Moderate persistent asthma with (acute) exacerbation: Secondary | ICD-10-CM

## 2014-09-21 DIAGNOSIS — R490 Dysphonia: Secondary | ICD-10-CM | POA: Insufficient documentation

## 2014-09-21 DIAGNOSIS — Z7951 Long term (current) use of inhaled steroids: Secondary | ICD-10-CM | POA: Insufficient documentation

## 2014-09-21 DIAGNOSIS — L309 Dermatitis, unspecified: Secondary | ICD-10-CM | POA: Insufficient documentation

## 2014-09-21 DIAGNOSIS — J45901 Unspecified asthma with (acute) exacerbation: Secondary | ICD-10-CM | POA: Insufficient documentation

## 2014-09-21 DIAGNOSIS — Z720 Tobacco use: Secondary | ICD-10-CM

## 2014-09-21 MED ORDER — NICOTINE 7 MG/24HR TD PT24: 7.0000 mg | MEDICATED_PATCH | Freq: Every day | TRANSDERMAL | Status: AC

## 2014-09-21 MED ORDER — ALBUTEROL SULFATE HFA 108 (90 BASE) MCG/ACT IN AERS: 2.0000 | INHALATION_SPRAY | Freq: Four times a day (QID) | RESPIRATORY_TRACT | Status: DC | PRN

## 2014-09-21 NOTE — Progress Notes (Signed)
HFU Following up on his bronchitis. Pt reports feeling fine accept his voice is still hoarse.

## 2014-09-21 NOTE — Progress Notes (Signed)
Subjective:    Patient ID: Harold Morales, male    DOB: [birth year], 29 y.o.   MRN: [id number]  HPI  Admit date: 09/12/2014 Discharge date: 09/13/2014  Harold Morales [facility] with nonproductive cough, chills sore throat and wheezing on medical history was notable for asthma but he was not on any medications and often times would use a friend's rescue inhaler. On presentation his oxygen saturation was 95-100%, he was tachycardic at 128-138 and had an elevated lactic acid 5.0  He was diagnosed with acute asthma exacerbation and started on IV steroids and Levaquin and was admitted to SDU on supplemental O2. His chest x-ray was negative for any acute cardiopulmonary process His O2 requirement decreased overnight, and by the morning his lungs were clear to ausculation and the patient was without SOB or DOE. His blood pressure was initially low normal on admission (106/41) but was more elevated at 140/73 prior to discharge.Lactic acid decreased to 1.7 and he was discharged on a steroid taper and Doxycycline.   Interval History: He reports doing better but still has some hoarseness in his voice; currently taking his Prednisone as prescribed and is [facility]  Past Medical History  Diagnosis Date  . Asthma   . Eczema     Past Surgical History  Procedure Laterality Date  . Appendectomy      History   Social History  . Marital Status: Single    Spouse Name: N/A  . Number of Children: N/A  . Years of Education: N/A   Occupational History  . Not on file.   Social History Main Topics  . Smoking status: Current Every Day Smoker -- 0.50 packs/day    Types: Cigarettes  . Smokeless tobacco: Not on file  . Alcohol Use: Yes     Comment: occasionally  . Drug Use: No  . Sexual Activity: Not on file   Other Topics Concern  . Not on file   Social History Narrative    History reviewed. No pertinent family history.  Allergies    Allergen Reactions  . Shrimp [Shellfish Allergy] Anaphylaxis    Throat swelling    Current Outpatient Prescriptions on File Prior to Visit  Medication Sig Dispense Refill  . beclomethasone (QVAR) 40 MCG/ACT inhaler Inhale 2 puffs into the lungs 2 (two) times daily. 1 Inhaler 12  . predniSONE (DELTASONE) 20 MG tablet Take as indicated in your discharge instructions 18 tablet 0  . diphenhydrAMINE (SOMINEX) 25 MG tablet Take 50 mg by mouth daily as needed for allergies.    Marland Kitchen. doxycycline (VIBRAMYCIN) 50 MG capsule Take 2 capsules (100 mg total) by mouth 2 (two) times daily. (Patient not taking: Reported on 09/21/2014) 6 capsule 0  . triamcinolone cream (KENALOG) 0.1 % Apply 1 application topically 2 (two) times daily. Apply to arms, legs for eczema     No current facility-administered medications on file prior to visit.    Review of Systems  General: negative for fever, weight loss, appetite change Eyes: no visual symptoms. ENT: no ear symptoms, no sinus tenderness, no nasal congestion or sore throat, voice hoarseness Neck: no pain  Respiratory: no wheezing, shortness of breath, cough Cardiovascular: no chest pain, no dyspnea on exertion, no pedal edema, no orthopnea. Gastrointestinal: no abdominal pain, no diarrhea, no constipation Genito-Urinary: no urinary frequency, no dysuria, no polyuria. Hematologic: no bruising Endocrine: no cold or heat intolerance Neurological: no headaches, no seizures, no tremors Musculoskeletal: no  joint pains, no joint swelling Skin: no pruritus, no rash. Psychological: no depression, no anxiety,       Objective: Filed Vitals:   09/21/14 1416  BP: 143/84  Pulse: 80  Temp: 98.4 F (36.9 C)  Resp: 16      Physical Exam  Constitutional: normal appearing,  Eyes: PERRLA HEENT: Head is atraumatic, normal sinuses, normal oropharynx, normal appearing tonsils and palate, tympanic membrane is normal bilaterally. Neck: normal range of motion, no  thyromegaly, no JVD Cardiovascular: normal rate and rhythm, normal heart sounds, no murmurs, rub or gallop, no pedal edema Respiratory: clear to auscultation bilaterally, no wheezes, no rales, no rhonchi Abdomen: right lower quadrant healed surgical scar soft, not tender to palpation, normal bowel sounds, no enlarged organs Extremities: Full ROM, no tenderness in joints Skin: warm and dry, no lesions. Neurological: alert, oriented x3, cranial nerves I-XII grossly intact , normal motor strength, normal sensation. Psychological: normal mood.         Assessment & Plan:  29 year old male patient with recent hospitalization for asthma exacerbation who have improved symptomatically  Asthma exacerbation: Secondary to bronchitis. Placed on MDI and advised to continue inhaled corticosteroid. Will need to reassess at next office visit for the need to upgrade will continue current treatment.  Elevated blood pressure: This could be steroid induced given he had no previous history of hypertension. Lifestyle modifications advised and I will reassess the status next visit for the need to commence antihypertensives  Tobacco abuse: Smoking cessation support: smoking cessation hotline: 1-800-QUIT-NOW.  Smoking cessation classes are available through [facility]. Call [phone] or visit our website at [email or web address].  Spent 3 minutes counseling on smoking cessation and patient is ready to quit and was placed on nicotine patches.

## 2014-09-21 NOTE — Patient Instructions (Signed)

## 2014-09-23 ENCOUNTER — Encounter: Payer: Self-pay | Admitting: Adult Health

## 2014-09-23 ENCOUNTER — Ambulatory Visit (INDEPENDENT_AMBULATORY_CARE_PROVIDER_SITE_OTHER): Payer: Self-pay | Admitting: Adult Health

## 2014-09-23 VITALS — BP 134/80 | HR 94 | Temp 98.2°F | Ht 72.0 in | Wt 245.6 lb

## 2014-09-23 DIAGNOSIS — J4541 Moderate persistent asthma with (acute) exacerbation: Secondary | ICD-10-CM

## 2014-09-23 NOTE — Patient Instructions (Signed)
Continue on QVAR  2puffs Twice daily  , rinse after use.  Continue to work on not smoking .  Follow up Dr. Kendrick FriesMcQuaid in 6 weeks with PFT  Please contact office for sooner follow up if symptoms do not improve or worsen or seek emergency care

## 2014-09-23 NOTE — Progress Notes (Signed)
I agree with this plan.

## 2014-09-23 NOTE — Assessment & Plan Note (Signed)
Recent severe asthma exacerbation, now resolved Patient is feeling improved Encouraged on medication compliance Inhaler education given Patient has been set up with community health and wellness Needs PFTs on return Smoking cessation discussed  Plan Continue on QVAR  2puffs Twice daily  , rinse after use.  Continue to work on not smoking .  Follow up Dr. Kendrick FriesMcQuaid in 6 weeks with PFT  Please contact office for sooner follow up if symptoms do not improve or worsen or seek emergency care

## 2014-09-23 NOTE — Progress Notes (Signed)
   Subjective:    Patient ID: Harold PiperWilliam Erekson, male    DOB: [birth year], 29 y.o.   MRN: [id number]  HPI 29 year old male smoker with asthma admitted 09/12/2014 by pulmonary/medical care for asthma exacerbation .   09/23/2014 post hospital follow-up Patient presented for post hospital follow-up Patient was admitted April 17 through April 18 for an acute asthma exacerbation Patient was admitted to stepdown due to severe asthma exacerbation. He was treated with antibiotics, steroids, and nebulized bronchodilators. Patient does not have any insurance and therefore was not on his maintenance therapy Patient was started on Qvar at discharge He was discharged on a prednisone taper, which he has 2 additional days left Patient does smoke and is encouraged on smoking cessation Patient has been set up with the health and wellness and has gotten his prescriptions through this program Since discharge. Patient is feeling improved with decreased cough, wheezing and shortness of breath. He denies any hemoptysis, chest pain, orthopnea, PND, leg swelling Patient has a Ventolin rescue inhaler, not used since discharge.  Patient works at [employer], and does have indoor pets. We discussed asthma triggers.    Review of Systems Constitutional:   No  weight loss, night sweats,  Fevers, chills, fatigue, or  lassitude.  HEENT:   No headaches,  Difficulty swallowing,  Tooth/dental problems, or  Sore throat,                No sneezing, itching, ear ache, nasal congestion, post nasal drip,   CV:  No chest pain,  Orthopnea, PND, swelling in lower extremities, anasarca, dizziness, palpitations, syncope.   GI  No heartburn, indigestion, abdominal pain, nausea, vomiting, diarrhea, change in bowel habits, loss of appetite, bloody stools.   Resp: No shortness of breath with exertion or at rest.  No excess mucus, no productive cough,  No non-productive cough,  No coughing up of blood.  No change in color of mucus.  No  wheezing.  No chest wall deformity  Skin: no rash or lesions.  GU: no dysuria, change in color of urine, no urgency or frequency.  No flank pain, no hematuria   MS:  No joint pain or swelling.  No decreased range of motion.  No back pain.  Psych:  No change in mood or affect. No depression or anxiety.  No memory loss.         Objective:   Physical Exam GEN: A/Ox3; pleasant , NAD, obese   HEENT:  [place]/AT,  EACs-clear, TMs-wnl, NOSE-clear, THROAT-clear, no lesions, no postnasal drip or exudate noted.   NECK:  Supple w/ fair ROM; no JVD; normal carotid impulses w/o bruits; no thyromegaly or nodules palpated; no lymphadenopathy.  RESP  Clear  P & A; w/o, wheezes/ rales/ or rhonchi.no accessory muscle use, no dullness to percussion  CARD:  RRR, no m/r/g  , no peripheral edema, pulses intact, no cyanosis or clubbing.  GI:   Soft & nt; nml bowel sounds; no organomegaly or masses detected.  Musco: Warm bil, no deformities or joint swelling noted.   Neuro: alert, no focal deficits noted.    Skin: Warm, no lesions or rashes        Assessment & Plan:

## 2014-10-15 ENCOUNTER — Ambulatory Visit: Payer: Self-pay

## 2014-10-15 ENCOUNTER — Ambulatory Visit: Payer: Self-pay | Admitting: Internal Medicine

## 2014-11-04 ENCOUNTER — Ambulatory Visit: Payer: Self-pay | Admitting: Pulmonary Disease

## 2015-08-10 ENCOUNTER — Emergency Department (HOSPITAL_COMMUNITY)
Admission: EM | Admit: 2015-08-10 | Discharge: 2015-08-10 | Disposition: A | Discharge status: Home / Self Care | Payer: Self-pay | Attending: Emergency Medicine | Admitting: Emergency Medicine

## 2015-08-10 ENCOUNTER — Emergency Department (HOSPITAL_COMMUNITY): Payer: Self-pay

## 2015-08-10 ENCOUNTER — Encounter (HOSPITAL_COMMUNITY): Payer: Self-pay | Admitting: Family Medicine

## 2015-08-10 DIAGNOSIS — Z872 Personal history of diseases of the skin and subcutaneous tissue: Secondary | ICD-10-CM | POA: Insufficient documentation

## 2015-08-10 DIAGNOSIS — Z7952 Long term (current) use of systemic steroids: Secondary | ICD-10-CM | POA: Insufficient documentation

## 2015-08-10 DIAGNOSIS — J45909 Unspecified asthma, uncomplicated: Secondary | ICD-10-CM | POA: Insufficient documentation

## 2015-08-10 DIAGNOSIS — F17210 Nicotine dependence, cigarettes, uncomplicated: Secondary | ICD-10-CM | POA: Insufficient documentation

## 2015-08-10 DIAGNOSIS — Z7951 Long term (current) use of inhaled steroids: Secondary | ICD-10-CM | POA: Insufficient documentation

## 2015-08-10 DIAGNOSIS — J36 Peritonsillar abscess: Secondary | ICD-10-CM | POA: Insufficient documentation

## 2015-08-10 LAB — CBC WITH DIFFERENTIAL/PLATELET
Basophils Absolute: 0.1 10*3/uL (ref 0.0–0.1)
Basophils Relative: 1 %
Eosinophils Absolute: 0.1 10*3/uL (ref 0.0–0.7)
Eosinophils Relative: 1 %
HCT: 45.9 % (ref 39.0–52.0)
HEMOGLOBIN: 16.1 g/dL (ref 13.0–17.0)
LYMPHS PCT: 17 %
Lymphs Abs: 2.1 10*3/uL (ref 0.7–4.0)
MCH: 30 pg (ref 26.0–34.0)
MCHC: 35.1 g/dL (ref 30.0–36.0)
MCV: 85.5 fL (ref 78.0–100.0)
Monocytes Absolute: 1.1 10*3/uL — ABNORMAL HIGH (ref 0.1–1.0)
Monocytes Relative: 9 %
NEUTROS ABS: 9.1 10*3/uL — AB (ref 1.7–7.7)
NEUTROS PCT: 72 %
Platelets: 74 10*3/uL — ABNORMAL LOW (ref 150–400)
RBC: 5.37 MIL/uL (ref 4.22–5.81)
RDW: 13.1 % (ref 11.5–15.5)
WBC: 12.5 10*3/uL — ABNORMAL HIGH (ref 4.0–10.5)

## 2015-08-10 LAB — COMPREHENSIVE METABOLIC PANEL
ALK PHOS: 62 U/L (ref 38–126)
ALT: 22 U/L (ref 17–63)
AST: 24 U/L (ref 15–41)
Albumin: 4.3 g/dL (ref 3.5–5.0)
Anion gap: 11 (ref 5–15)
BUN: 8 mg/dL (ref 6–20)
CALCIUM: 9.6 mg/dL (ref 8.9–10.3)
CO2: 22 mmol/L (ref 22–32)
CREATININE: 1.09 mg/dL (ref 0.61–1.24)
Chloride: 107 mmol/L (ref 101–111)
GFR calc Af Amer: >60 mL/min (ref >60)
GFR calc non Af Amer: >60 mL/min (ref >60)
GLUCOSE: 89 mg/dL (ref 65–99)
Potassium: 4.4 mmol/L (ref 3.5–5.1)
SODIUM: 140 mmol/L (ref 135–145)
Total Bilirubin: 1.3 mg/dL — ABNORMAL HIGH (ref 0.3–1.2)
Total Protein: 7.6 g/dL (ref 6.5–8.1)

## 2015-08-10 MED ORDER — IOHEXOL 300 MG/ML  SOLN
75.0000 mL | Freq: Once | INTRAMUSCULAR | Status: AC | PRN
  Administered 2015-08-10: 75 mL via INTRAVENOUS

## 2015-08-10 MED ORDER — DEXAMETHASONE SODIUM PHOSPHATE 10 MG/ML IJ SOLN
10.0000 mg | Freq: Once | INTRAMUSCULAR | Status: AC
  Administered 2015-08-10: 10 mg via INTRAVENOUS
  Filled 2015-08-10: qty 1

## 2015-08-10 MED ORDER — DEXAMETHASONE SODIUM PHOSPHATE 4 MG/ML IJ SOLN: 4.0000 mg | Freq: Once | INTRAMUSCULAR | Status: DC

## 2015-08-10 MED ORDER — CLINDAMYCIN HCL 300 MG PO CAPS: 300.0000 mg | ORAL_CAPSULE | Freq: Four times a day (QID) | ORAL | Status: DC

## 2015-08-10 MED ORDER — ONDANSETRON HCL 4 MG/2ML IJ SOLN
4.0000 mg | Freq: Once | INTRAMUSCULAR | Status: AC
  Administered 2015-08-10: 4 mg via INTRAVENOUS
  Filled 2015-08-10: qty 2

## 2015-08-10 MED ORDER — CLINDAMYCIN PHOSPHATE 600 MG/50ML IV SOLN
600.0000 mg | Freq: Once | INTRAVENOUS | Status: AC
  Administered 2015-08-10: 600 mg via INTRAVENOUS
  Filled 2015-08-10: qty 50

## 2015-08-10 MED ORDER — MORPHINE SULFATE (PF) 4 MG/ML IV SOLN
4.0000 mg | Freq: Once | INTRAVENOUS | Status: AC
  Administered 2015-08-10: 4 mg via INTRAVENOUS
  Filled 2015-08-10: qty 1

## 2015-08-10 MED ORDER — SODIUM CHLORIDE 0.9 % IV SOLN
Freq: Once | INTRAVENOUS | Status: AC
  Administered 2015-08-10: 11:00:00 via INTRAVENOUS

## 2015-08-10 MED ORDER — HYDROCODONE-ACETAMINOPHEN 5-325 MG PO TABS: 2.0000 | ORAL_TABLET | ORAL | Status: DC | PRN

## 2015-08-10 NOTE — Discharge Instructions (Signed)
Peritonsillar Abscess °A peritonsillar abscess is a collection of yellowish-white fluid (pus) in the back of the throat behind the tonsils. It usually occurs when an infection of the throat or tonsils (tonsillitis) spreads into the tissues around the tonsils. °CAUSES °The infection that leads to a peritonsillar abscess is usually caused by streptococcal bacteria.  °SIGNS AND SYMPTOMS °· Sore throat, often with pain on just one side. °· Swelling and tenderness of the glands (lymph nodes) in the neck. °· Difficulty swallowing. °· Difficulty opening your mouth. °· Fever. °· Chills. °· Drooling because of difficulty swallowing saliva. °· Headache. °· Changes in your voice. °· Bad breath. °DIAGNOSIS °Your health care provider will take your medical history and do a physical exam. Imaging tests may be done, such as an ultrasound or CT scan. A sample of pus may be removed from the abscess using a needle (needle aspiration) or by swabbing the back of your throat. This sample will be sent to a lab for testing. °TREATMENT °Treatment usually involves draining the pus from the abscess. This may be done through needle aspiration or by making an incision in the abscess. You will also likely need to take antibiotic medicine. °HOME CARE INSTRUCTIONS °· Rest as much as possible and get plenty of sleep. °· Take medicines only as directed by your health care provider. °· If you were prescribed an antibiotic medicine, finish it all even if you start to feel better. °· If your abscess was drained by your health care provider, gargle with a mixture of salt and warm water: °¨ Mix 1 tsp of salt in 8 oz of warm water. °¨ Gargle with this mixture four times per day or as needed for comfort. °¨ Do not swallow this mixture. °· Drink plenty of fluids. °· While your throat is sore, eat soft or liquid foods, such as frozen ice pops and ice cream. °· Keep all follow-up visits as directed by your health care provider. This is important. °SEEK  MEDICAL CARE IF: °· You have increased pain, swelling, redness, or drainage in your throat. °· You develop a headache, a lack of energy (lethargy), or generalized feelings of illness. °· You have a fever. °· You feel dizzy. °· You have difficulty swallowing or eating. °· You show signs of becoming dehydrated, such as: °¨ Light-headedness when standing. °¨ Decreased urine output. °¨ A fast heart rate. °¨ Dry mouth. °SEEK IMMEDIATE MEDICAL CARE IF:  °· You have difficulty talking or breathing, or you find it easier to breathe when you lean forward. °· You are coughing up blood or vomiting blood. °· You have severe throat pain that is not helped by medicines. °· You start to drool. °  °This information is not intended to replace advice given to you by your health care provider. Make sure you discuss any questions you have with your health care provider. °  °Document Released: 05/14/2005 Document Revised: 06/04/2014 Document Reviewed: 12/28/2013 °Elsevier Interactive Patient Education ©2016 [employer] ° °

## 2015-08-10 NOTE — ED Notes (Signed)
Pt here for pain with swallowing. sts pain to the right throat/neck area. sts spitting up small amount of blood. Pt lymph nodes on the right swollen.

## 2015-08-10 NOTE — ED Provider Notes (Signed)
CSN: [id number]     Arrival date & time 08/10/15  0902 History   First MD Initiated Contact with Patient 08/10/15 [phone]     Chief Complaint  Patient presents with  . Neck Pain     (Consider location/radiation/quality/duration/timing/severity/associated sxs/prior Treatment) Patient is a 30 y.o. male presenting with pharyngitis. The history is provided by the patient. No language interpreter was used.  Sore Throat This is a new problem. The current episode started in the past 7 days. The problem occurs constantly. The problem has been gradually worsening. Associated symptoms include a fever and neck pain. Nothing aggravates the symptoms. He has tried nothing for the symptoms. The treatment provided moderate relief.  Pt complains of pain with swallowing.  Swollen tonsil on the right  Past Medical History  Diagnosis Date  . Asthma   . Eczema    Past Surgical History  Procedure Laterality Date  . Appendectomy     History reviewed. No pertinent family history. Social History  Substance Use Topics  . Smoking status: Current Every Day Smoker -- 0.50 packs/day    Types: Cigarettes  . Smokeless tobacco: None  . Alcohol Use: Yes     Comment: occasionally    Review of Systems  Constitutional: Positive for fever.  Musculoskeletal: Positive for neck pain.  All other systems reviewed and are negative.     Allergies  Shrimp  Home Medications   Prior to Admission medications   Medication Sig Start Date End Date Taking? Authorizing Provider  albuterol (PROVENTIL HFA;VENTOLIN HFA) 108 (90 BASE) MCG/ACT inhaler Inhale 2 puffs into the lungs every 6 (six) hours as needed for wheezing or shortness of breath. Patient not taking: Reported on 09/23/2014 09/21/14   Jaclyn Shaggy, MD  beclomethasone (QVAR) 40 MCG/ACT inhaler Inhale 2 puffs into the lungs 2 (two) times daily. 09/13/14   Genelle Gather, MD  diphenhydrAMINE (SOMINEX) 25 MG tablet Take 50 mg by mouth daily as needed for allergies.     Historical Provider, MD  doxycycline (VIBRAMYCIN) 50 MG capsule Take 2 capsules (100 mg total) by mouth 2 (two) times daily. Patient not taking: Reported on 09/21/2014 09/14/14   Genelle Gather, MD  nicotine (NICODERM CQ) 7 mg/24hr patch Place 1 patch (7 mg total) onto the skin daily. Patient not taking: Reported on 09/23/2014 09/21/14   Jaclyn Shaggy, MD  predniSONE (DELTASONE) 20 MG tablet Take as indicated in your discharge instructions 09/13/14   Genelle Gather, MD  triamcinolone cream (KENALOG) 0.1 % Apply 1 application topically 2 (two) times daily. Apply to arms, legs for eczema    Historical Provider, MD   BP 145/66 mmHg  Pulse 70  Temp(Src) 99.1 F (37.3 C)  Resp 18  SpO2 100% Physical Exam  Constitutional: He is oriented to person, place, and time. He appears well-developed and well-nourished.  HENT:  Head: Normocephalic and atraumatic.  Right Ear: External ear normal.  Left Ear: External ear normal.  Nose: Nose normal.  Mouth/Throat: Oropharyngeal exudate present.  Swollen right tonsil, edema palate,  Uvula midline,    Eyes: Conjunctivae and EOM are normal. Pupils are equal, round, and reactive to light.  Neck: Normal range of motion.  Cardiovascular: Normal rate and normal heart sounds.   Pulmonary/Chest: Effort normal.  Abdominal: Soft. He exhibits no distension.  Musculoskeletal: Normal range of motion.  Neurological: He is alert and oriented to person, place, and time.  Psychiatric: He has a normal mood and affect.  Nursing note and vitals reviewed.  ED Course  Procedures (including critical care time) Labs Review Labs Reviewed  CBC WITH DIFFERENTIAL/PLATELET - Abnormal; Notable for the following:    WBC 12.5 (*)    All other components within normal limits  COMPREHENSIVE METABOLIC PANEL - Abnormal; Notable for the following:    Total Bilirubin 1.3 (*)    All other components within normal limits    Imaging Review Ct Soft Tissue Neck W Contrast  08/10/2015   CLINICAL DATA:  Dysphagia.  Tonsillar swelling. EXAM: CT NECK WITH CONTRAST TECHNIQUE: Multidetector CT imaging of the neck was performed using the standard protocol following the bolus administration of intravenous contrast. CONTRAST:  75mL OMNIPAQUE IOHEXOL 300 MG/ML  SOLN COMPARISON:  None FINDINGS: Pharynx and larynx: Asymmetric enhancement of the right tonsillar area measures 2.7 x 2.5 x 2.9 Cm. This likely reflects focal tonsillitis and potentially a developing peritonsillar abscess. There is inflammation and less distinct enhancement of the left palatine tonsil is well. This creates some narrowing of the oral pharyngeal airway. The tongue base is within normal limits. The vocal cords are closed. No other focal mucosal lesions are evident. Salivary glands: The submandibular and parotid glands are normal bilaterally. Thyroid: Negative Lymph nodes: Enlarged right level 2 lymph node 1 oral mass measures 1.7 x 2.3 by 1.7 cm. Two additional posterior right level 2 lymph nodes measure 1.3 and 1.4 cm respectively. The largest right submandibular node measures 13 mm in long axis. Smaller left-sided reactive type lymph nodes are present. Vascular: Negative. Limited intracranial: Within normal limits Visualized orbits: Globes and orbits are intact. Mastoids and visualized paranasal sinuses: A small fluid level is present in the right maxillary sinus. The remaining paranasal sinuses and the mastoid air cells are clear. Skeleton: Bone windows demonstrate slight reversal of the normal cervical lordosis. No focal lytic or blastic lesions are present. Vertebral body heights and alignment are maintained. Upper chest: The lung apices are clear. IMPRESSION: 1. 2.9 cm right peritonsillar lesion likely represents vein at developing peritonsillar abscess. There is no drainable abscess at this time. 2. Bilateral palatine tonsil inflammatory change compatible with acute tonsillitis. 3. Right greater than left cervical adenopathy is  likely reactive. Electronically Signed   By: Marin Roberts M.D.   On: 08/10/2015 12:03   I have personally reviewed and evaluated these images and lab results as part of my medical decision-making.   EKG Interpretation None      MDM Pt given Iv fluids, decadron and clindamycin Iv.  Pt has elevated wbc's 12.5.  Dr. Anitra Lauth in to see.  I spoke to Dr. Jearld Fenton who advised follow up in the office tomorrow.  Pt given rx for clindamycin and hydrocodone.   Final diagnoses:  Tonsillar abscess   Meds ordered this encounter  Medications  . 0.9 %  sodium chloride infusion    Sig:   . ondansetron (ZOFRAN) injection 4 mg    Sig:   . morphine 4 MG/ML injection 4 mg    Sig:   . clindamycin (CLEOCIN) IVPB 600 mg    Sig:     Order Specific Question:  Antibiotic Indication:    Answer:  Other Indication (list below)  . DISCONTD: dexamethasone (DECADRON) injection 4 mg    Sig:   . dexamethasone (DECADRON) injection 10 mg    Sig:   . iohexol (OMNIPAQUE) 300 MG/ML solution 75 mL    Sig:   . clindamycin (CLEOCIN) 300 MG capsule    Sig: Take 1 capsule (300 mg total) by mouth  every 6 (six) hours.    Dispense:  28 capsule    Refill:  0    Order Specific Question:  Supervising Provider    Answer:  MILLER, BRIAN [[id number]]  . HYDROcodone-acetaminophen (NORCO/VICODIN) 5-325 MG tablet    Sig: Take 2 tablets by mouth every 4 (four) hours as needed.    Dispense:  16 tablet    Refill:  0    Order Specific Question:  Supervising Provider    Answer:  Eber HongMILLER, BRIAN [[id number]]   An After Visit Summary was printed and given to the patient.    Lonia SkinnerLeslie K CrestviewSofia, PA-C 08/10/15 1417  Gwyneth SproutWhitney Plunkett, MD 08/11/15 [phone]

## 2015-08-10 NOTE — ED Notes (Signed)
Pt ambulatory at discharge denies any concerns about discharge

## 2015-08-10 NOTE — Progress Notes (Signed)
Spoke to patient regarding primary care resources and [facility]. Appointment made to establish to primary care with [facility] for Thursday March 30,2017 @ 2:30pm, pt verbalized understanding. Orange card application provided and explained. Patient instructed to contact me once application is complete for an eligibility appointment. No other Community Health & Eligibility Specialist needs identified at this time.   Buddy DutyFelicia Evans [facility] [phone]

## 2015-08-25 ENCOUNTER — Ambulatory Visit (INDEPENDENT_AMBULATORY_CARE_PROVIDER_SITE_OTHER): Payer: No Typology Code available for payment source | Admitting: Family Medicine

## 2015-08-25 ENCOUNTER — Encounter: Payer: Self-pay | Admitting: Family Medicine

## 2015-08-25 ENCOUNTER — Other Ambulatory Visit: Payer: Self-pay | Admitting: Family Medicine

## 2015-08-25 VITALS — BP 127/60 | HR 67 | Temp 97.8°F | Resp 16 | Ht 69.5 in | Wt 228.0 lb

## 2015-08-25 DIAGNOSIS — L309 Dermatitis, unspecified: Secondary | ICD-10-CM

## 2015-08-25 DIAGNOSIS — J4520 Mild intermittent asthma, uncomplicated: Secondary | ICD-10-CM

## 2015-08-25 DIAGNOSIS — D72829 Elevated white blood cell count, unspecified: Secondary | ICD-10-CM

## 2015-08-25 DIAGNOSIS — E669 Obesity, unspecified: Secondary | ICD-10-CM

## 2015-08-25 DIAGNOSIS — M549 Dorsalgia, unspecified: Secondary | ICD-10-CM

## 2015-08-25 DIAGNOSIS — G8929 Other chronic pain: Secondary | ICD-10-CM

## 2015-08-25 DIAGNOSIS — Z23 Encounter for immunization: Secondary | ICD-10-CM

## 2015-08-25 LAB — POCT URINALYSIS DIP (DEVICE)
Glucose, UA: NEGATIVE mg/dL
LEUKOCYTES UA: NEGATIVE
Nitrite: NEGATIVE
PH: 6 (ref 5.0–8.0)
Protein, ur: 30 mg/dL — AB
Urobilinogen, UA: 1 mg/dL (ref 0.0–1.0)

## 2015-08-25 LAB — CBC WITH DIFFERENTIAL/PLATELET
BASOS ABS: 0.1 10*3/uL (ref 0.0–0.1)
Basophils Relative: 1 % (ref 0–1)
EOS ABS: 0.2 10*3/uL (ref 0.0–0.7)
EOS PCT: 2 % (ref 0–5)
HEMATOCRIT: 43.9 % (ref 39.0–52.0)
Hemoglobin: 15 g/dL (ref 13.0–17.0)
LYMPHS ABS: 2.6 10*3/uL (ref 0.7–4.0)
LYMPHS PCT: 29 % (ref 12–46)
MCH: 28.8 pg (ref 26.0–34.0)
MCHC: 34.2 g/dL (ref 30.0–36.0)
MCV: 84.4 fL (ref 78.0–100.0)
MONOS PCT: 8 % (ref 3–12)
MPV: 10.1 fL (ref 8.6–12.4)
Monocytes Absolute: 0.7 10*3/uL (ref 0.1–1.0)
Neutro Abs: 5.4 10*3/uL (ref 1.7–7.7)
Neutrophils Relative %: 60 % (ref 43–77)
Platelets: 279 10*3/uL (ref 150–400)
RBC: 5.2 MIL/uL (ref 4.22–5.81)
RDW: 13.8 % (ref 11.5–15.5)
WBC: 9 10*3/uL (ref 4.0–10.5)

## 2015-08-25 LAB — TSH: TSH: 0.36 mIU/L — ABNORMAL LOW (ref 0.40–4.50)

## 2015-08-25 MED ORDER — TRIAMCINOLONE ACETONIDE 0.1 % EX CREA: 1.0000 "application " | TOPICAL_CREAM | Freq: Three times a day (TID) | CUTANEOUS | Status: DC

## 2015-08-25 MED ORDER — BECLOMETHASONE DIPROPIONATE 40 MCG/ACT IN AERS: 2.0000 | INHALATION_SPRAY | Freq: Two times a day (BID) | RESPIRATORY_TRACT | Status: AC

## 2015-08-25 MED ORDER — MONTELUKAST SODIUM 10 MG PO TABS: 10.0000 mg | ORAL_TABLET | Freq: Every day | ORAL | Status: DC

## 2015-08-25 MED ORDER — MELOXICAM 7.5 MG PO TABS: 7.5000 mg | ORAL_TABLET | Freq: Every day | ORAL | Status: AC

## 2015-08-25 MED ORDER — ALBUTEROL SULFATE HFA 108 (90 BASE) MCG/ACT IN AERS: 2.0000 | INHALATION_SPRAY | Freq: Four times a day (QID) | RESPIRATORY_TRACT | Status: DC | PRN

## 2015-08-25 MED FILL — MELOXICAM 7.5 MG TABLET: 7.5 | 30 days supply | Qty: 30 | Fill #0

## 2015-08-25 MED FILL — TRIAMCINOLONE 0.1% CREAM: 0.1 | 10 days supply | Qty: 30 | Fill #0

## 2015-08-25 MED FILL — !VENTOLIN HFA INHALER: 108 (90 BAS | 27 days supply | Qty: 18 | Fill #0

## 2015-08-25 MED FILL — !QVAR 40 MCG ORAL INHALER: 40 MCG | 30 days supply | Qty: 1 | Fill #0

## 2015-08-25 MED FILL — ?MONTELUKAST SOD 10 MG TAB: 10 | 30 days supply | Qty: 30 | Fill #0

## 2015-08-25 NOTE — Patient Instructions (Signed)
Eczema Eczema, also called atopic dermatitis, is a skin disorder that causes inflammation of the skin. It causes a red rash and dry, scaly skin. The skin becomes very itchy. Eczema is generally worse during the cooler winter months and often improves with the warmth of summer. Eczema usually starts showing signs in infancy. Some children outgrow eczema, but it may last through adulthood.  CAUSES  The exact cause of eczema is not known, but it appears to run in families. People with eczema often have a family history of eczema, allergies, asthma, or hay fever. Eczema is not contagious. Flare-ups of the condition may be caused by:  1. Contact with something you are sensitive or allergic to.  2. Stress. SIGNS AND SYMPTOMS 1. Dry, scaly skin.  2. Red, itchy rash.  3. Itchiness. This may occur before the skin rash and may be very intense.  DIAGNOSIS  The diagnosis of eczema is usually made based on symptoms and medical history. TREATMENT  Eczema cannot be cured, but symptoms usually can be controlled with treatment and other strategies. A treatment plan might include: 1. Controlling the itching and scratching.  1. Use over-the-counter antihistamines as directed for itching. This is especially useful at night when the itching tends to be worse.  2. Use over-the-counter steroid creams as directed for itching.  3. Avoid scratching. Scratching makes the rash and itching worse. It may also result in a skin infection (impetigo) due to a break in the skin caused by scratching.  2. Keeping the skin well moisturized with creams every day. This will seal in moisture and help prevent dryness. Lotions that contain alcohol and water should be avoided because they can dry the skin.  3. Limiting exposure to things that you are sensitive or allergic to (allergens).  4. Recognizing situations that cause stress.  5. Developing a plan to manage stress.  HOME CARE INSTRUCTIONS  1. Only take  over-the-counter or prescription medicines as directed by your health care provider.  2. Do not use anything on the skin without checking with your health care provider.  3. Keep baths or showers short (5 minutes) in warm (not hot) water. Use mild cleansers for bathing. These should be unscented. You may add nonperfumed bath oil to the bath water. It is best to avoid soap and bubble bath.  4. Immediately after a bath or shower, when the skin is still damp, apply a moisturizing ointment to the entire body. This ointment should be a petroleum ointment. This will seal in moisture and help prevent dryness. The thicker the ointment, the better. These should be unscented.  5. Keep fingernails cut short. Children with eczema may need to wear soft gloves or mittens at night after applying an ointment.  6. Dress in clothes made of cotton or cotton blends. Dress lightly, because heat increases itching.  7. A child with eczema should stay away from anyone with fever blisters or cold sores. The virus that causes fever blisters (herpes simplex) can cause a serious skin infection in children with eczema. SEEK MEDICAL CARE IF:  1. Your itching interferes with sleep.  2. Your rash gets worse or is not better within 1 week after starting treatment.  3. You see pus or soft yellow scabs in the rash area.  4. You have a fever.  5. You have a rash flare-up after contact with someone who has fever blisters.    This information is not intended to replace advice given to you by your health care  provider. Make sure you discuss any questions you have with your health care provider.   Document Released: 05/11/2000 Document Revised: 03/04/2013 Document Reviewed: 12/15/2012 Elsevier Interactive Patient Education 2016 [employer] Asthma, Adult Asthma is a recurring condition in which the airways tighten and narrow. Asthma can make it difficult to breathe. It can cause coughing, wheezing, and shortness of breath.  Asthma episodes, also called asthma attacks, range from minor to life-threatening. Asthma cannot be cured, but medicines and lifestyle changes can help control it. CAUSES Asthma is believed to be caused by inherited (genetic) and environmental factors, but its exact cause is unknown. Asthma may be triggered by allergens, lung infections, or irritants in the air. Asthma triggers are different for each person. Common triggers include:  3. Animal dander. 4. Dust mites. 5. Cockroaches. 6. Pollen from trees or grass. 7. Mold. 8. Smoke. 9. Air pollutants such as dust, household cleaners, hair sprays, aerosol sprays, paint fumes, strong chemicals, or strong odors. 10. Cold air, weather changes, and winds (which increase molds and pollens in the air). 11. Strong emotional expressions such as crying or laughing hard. 12. Stress. 13. Certain medicines (such as aspirin) or types of drugs (such as beta-blockers). 14. Sulfites in foods and drinks. Foods and drinks that may contain sulfites include dried fruit, potato chips, and sparkling grape juice. 15. Infections or inflammatory conditions such as the flu, a cold, or an inflammation of the nasal membranes (rhinitis). 16. Gastroesophageal reflux disease (GERD). 17. Exercise or strenuous activity. SYMPTOMS Symptoms may occur immediately after asthma is triggered or many hours later. Symptoms include: 4. Wheezing. 5. Excessive nighttime or early morning coughing. 6. Frequent or severe coughing with a common cold. 7. Chest tightness. 8. Shortness of breath. DIAGNOSIS  The diagnosis of asthma is made by a review of your medical history and a physical exam. Tests may also be performed. These may include: 6. Lung function studies. These tests show how much air you breathe in and out. 7. Allergy tests. 8. Imaging tests such as X-rays. TREATMENT  Asthma cannot be cured, but it can usually be controlled. Treatment involves identifying and avoiding your  asthma triggers. It also involves medicines. There are 2 classes of medicine used for asthma treatment:  8. Controller medicines. These prevent asthma symptoms from occurring. They are usually taken every day. 9. Reliever or rescue medicines. These quickly relieve asthma symptoms. They are used as needed and provide short-term relief. Your health care provider will help you create an asthma action plan. An asthma action plan is a written plan for managing and treating your asthma attacks. It includes a list of your asthma triggers and how they may be avoided. It also includes information on when medicines should be taken and when their dosage should be changed. An action plan may also involve the use of a device called a peak flow meter. A peak flow meter measures how well the lungs are working. It helps you monitor your condition. HOME CARE INSTRUCTIONS  6. Take medicines only as directed by your health care provider. Speak with your health care provider if you have questions about how or when to take the medicines. 7. Use a peak flow meter as directed by your health care provider. Record and keep track of readings. 8. Understand and use the action plan to help minimize or stop an asthma attack without needing to seek medical care. 9. Control your home environment in the following ways to help prevent asthma attacks: 1. Do not  smoke. Avoid being exposed to secondhand smoke. 2. Change your heating and air conditioning filter regularly. 3. Limit your use of fireplaces and wood stoves. 4. Get rid of pests (such as roaches and mice) and their droppings. 5. Throw away plants if you see mold on them. 6. Clean your floors and dust regularly. Use unscented cleaning products. 7. Try to have someone else vacuum for you regularly. Stay out of rooms while they are being vacuumed and for a short while afterward. If you vacuum, use a dust mask from a hardware store, a double-layered or microfilter vacuum cleaner  bag, or a vacuum cleaner with a HEPA filter. 8. Replace carpet with wood, tile, or vinyl flooring. Carpet can trap dander and dust. 9. Use allergy-proof pillows, mattress covers, and box spring covers. 10. Wash bed sheets and blankets every week in hot water and dry them in a dryer. 11. Use blankets that are made of polyester or cotton. 12. Clean bathrooms and kitchens with bleach. If possible, have someone repaint the walls in these rooms with mold-resistant paint. Keep out of the rooms that are being cleaned and painted. 13. Wash hands frequently. SEEK MEDICAL CARE IF:  1. You have wheezing, shortness of breath, or a cough even if taking medicine to prevent attacks. 2. The colored mucus you cough up (sputum) is thicker than usual. 3. Your sputum changes from clear or white to yellow, green, gray, or bloody. 4. You have any problems that may be related to the medicines you are taking (such as a rash, itching, swelling, or trouble breathing). 5. You are using a reliever medicine more than 2-3 times per week. 6. Your peak flow is still at 50-79% of your personal best after following your action plan for 1 hour. 7. You have a fever. SEEK IMMEDIATE MEDICAL CARE IF:  1. You seem to be getting worse and are unresponsive to treatment during an asthma attack. 2. You are short of breath even at rest. 3. You get short of breath when doing very little physical activity. 4. You have difficulty eating, drinking, or talking due to asthma symptoms. 5. You develop chest pain. 6. You develop a fast heartbeat. 7. You have a bluish color to your lips or fingernails. 8. You are light-headed, dizzy, or faint. 9. Your peak flow is less than 50% of your personal best.   This information is not intended to replace advice given to you by your health care provider. Make sure you discuss any questions you have with your health care provider.   Document Released: 05/14/2005 Document Revised: 02/02/2015 Document  Reviewed: 12/11/2012 Elsevier Interactive Patient Education 2016 [employer] Fat and Cholesterol Restricted Diet High levels of fat and cholesterol in your blood may lead to various health problems, such as diseases of the heart, blood vessels, gallbladder, liver, and pancreas. Fats are concentrated sources of energy that come in various forms. Certain types of fat, including saturated fat, may be harmful in excess. Cholesterol is a substance needed by your body in small amounts. Your body makes all the cholesterol it needs. Excess cholesterol comes from the food you eat. When you have high levels of cholesterol and saturated fat in your blood, health problems can develop because the excess fat and cholesterol will gather along the walls of your blood vessels, causing them to narrow. Choosing the right foods will help you control your intake of fat and cholesterol. This will help keep the levels of these substances in your blood within normal  limits and reduce your risk of disease. WHAT IS MY PLAN? Your health care provider recommends that you: 18. Get no more than __________ % of the total calories in your daily diet from fat. 19. Limit your intake of saturated fat to less than ______% of your total calories each day. 20. Limit the amount of cholesterol in your diet to less than _________mg per day. WHAT TYPES OF FAT SHOULD I CHOOSE? 9. Choose healthy fats more often. Choose monounsaturated and polyunsaturated fats, such as olive and canola oil, flaxseeds, walnuts, almonds, and seeds. 10. Eat more omega-3 fats. Good choices include salmon, mackerel, sardines, tuna, flaxseed oil, and ground flaxseeds. Aim to eat fish at least two times a week. 11. Limit saturated fats. Saturated fats are primarily found in animal products, such as meats, butter, and cream. Plant sources of saturated fats include palm oil, palm kernel oil, and coconut oil. 12. Avoid foods with partially hydrogenated oils in them.  These contain trans fats. Examples of foods that contain trans fats are stick margarine, some tub margarines, cookies, crackers, and other baked goods. WHAT GENERAL GUIDELINES DO I NEED TO FOLLOW? These guidelines for healthy eating will help you control your intake of fat and cholesterol: 9. Check food labels carefully to identify foods with trans fats or high amounts of saturated fat. 10. Fill one half of your plate with vegetables and green salads. 11. Fill one fourth of your plate with whole grains. Look for the word "whole" as the first word in the ingredient list. 12. Fill one fourth of your plate with lean protein foods. 13. Limit fruit to two servings a day. Choose fruit instead of juice. 14. Eat more foods that contain soluble fiber. Examples of foods that contain this type of fiber are apples, broccoli, carrots, beans, peas, and barley. Aim to get 20-30 g of fiber per day. 15. Eat more home-cooked food and less restaurant, buffet, and fast food. 16. Limit or avoid alcohol. 17. Limit foods high in starch and sugar. 18. Limit fried foods. 19. Cook foods using methods other than frying. Baking, boiling, grilling, and broiling are all great options. 20. Lose weight if you are overweight. Losing just 5-10% of your initial body weight can help your overall health and prevent diseases such as diabetes and heart disease. WHAT FOODS CAN I EAT? Grains Whole grains, such as whole wheat or whole grain breads, crackers, cereals, and pasta. Unsweetened oatmeal, bulgur, barley, quinoa, or brown rice. Corn or whole wheat flour tortillas. Vegetables Fresh or frozen vegetables (raw, steamed, roasted, or grilled). Green salads. Fruits All fresh, canned (in natural juice), or frozen fruits. Meat and Other Protein Products Ground beef (85% or leaner), grass-fed beef, or beef trimmed of fat. Skinless chicken or [place]. Ground chicken or [place]. Pork trimmed of fat. All fish and seafood. Eggs. Dried beans,  peas, or lentils. Unsalted nuts or seeds. Unsalted canned or dry beans. Dairy Low-fat dairy products, such as skim or 1% milk, 2% or reduced-fat cheeses, low-fat ricotta or cottage cheese, or plain low-fat yogurt. Fats and Oils Tub margarines without trans fats. Light or reduced-fat mayonnaise and salad dressings. Avocado. Olive, canola, sesame, or safflower oils. Natural peanut or almond butter (choose ones without added sugar and oil). The items listed above may not be a complete list of recommended foods or beverages. Contact your dietitian for more options. WHAT FOODS ARE NOT RECOMMENDED? Grains White bread. White pasta. White rice. Cornbread. Bagels, pastries, and croissants. Crackers that contain trans  fat. Vegetables White potatoes. Corn. Creamed or fried vegetables. Vegetables in a cheese sauce. Fruits Dried fruits. Canned fruit in light or heavy syrup. Fruit juice. Meat and Other Protein Products Fatty cuts of meat. Ribs, chicken wings, bacon, sausage, bologna, salami, chitterlings, fatback, hot dogs, bratwurst, and packaged luncheon meats. Liver and organ meats. Dairy Whole or 2% milk, cream, half-and-half, and cream cheese. Whole milk cheeses. Whole-fat or sweetened yogurt. Full-fat cheeses. Nondairy creamers and whipped toppings. Processed cheese, cheese spreads, or cheese curds. Sweets and Desserts Corn syrup, sugars, honey, and molasses. Candy. Jam and jelly. Syrup. Sweetened cereals. Cookies, pies, cakes, donuts, muffins, and ice cream. Fats and Oils Butter, stick margarine, lard, shortening, ghee, or bacon fat. Coconut, palm kernel, or palm oils. Beverages Alcohol. Sweetened drinks (such as sodas, lemonade, and fruit drinks or punches). The items listed above may not be a complete list of foods and beverages to avoid. Contact your dietitian for more information.   This information is not intended to replace advice given to you by your health care provider. Make sure you  discuss any questions you have with your health care provider.   Document Released: 05/14/2005 Document Revised: 06/04/2014 Document Reviewed: 08/12/2013 Elsevier Interactive Patient Education 2016 [employer] Back Exercises The following exercises strengthen the muscles that help to support the back. They also help to keep the lower back flexible. Doing these exercises can help to prevent back pain or lessen existing pain. If you have back pain or discomfort, try doing these exercises 2-3 times each day or as told by your health care provider. When the pain goes away, do them once each day, but increase the number of times that you repeat the steps for each exercise (do more repetitions). If you do not have back pain or discomfort, do these exercises once each day or as told by your health care provider. EXERCISES Single Knee to Chest Repeat these steps 3-5 times for each leg: 21. Lie on your back on a firm bed or the floor with your legs extended. 22. Bring one knee to your chest. Your other leg should stay extended and in contact with the floor. 23. Hold your knee in place by grabbing your knee or thigh. 24. Pull on your knee until you feel a gentle stretch in your lower back. 25. Hold the stretch for 10-30 seconds. 26. Slowly release and straighten your leg. Pelvic Tilt Repeat these steps 5-10 times: 13. Lie on your back on a firm bed or the floor with your legs extended. 14. Bend your knees so they are pointing toward the ceiling and your feet are flat on the floor. 15. Tighten your lower abdominal muscles to press your lower back against the floor. This motion will tilt your pelvis so your tailbone points up toward the ceiling instead of pointing to your feet or the floor. 16. With gentle tension and even breathing, hold this position for 5-10 seconds. Cat-Cow Repeat these steps until your lower back becomes more flexible: 21. Get into a hands-and-knees position on a firm surface.  Keep your hands under your shoulders, and keep your knees under your hips. You may place padding under your knees for comfort. 22. Let your head hang down, and point your tailbone toward the floor so your lower back becomes rounded like the back of a cat. 23. Hold this position for 5 seconds. 24. Slowly lift your head and point your tailbone up toward the ceiling so your back forms a sagging arch like  the back of a cow. 25. Hold this position for 5 seconds. Press-Ups Repeat these steps 5-10 times: 10. Lie on your abdomen (face-down) on the floor. 11. Place your palms near your head, about shoulder-width apart. 12. While you keep your back as relaxed as possible and keep your hips on the floor, slowly straighten your arms to raise the top half of your body and lift your shoulders. Do not use your back muscles to raise your upper torso. You may adjust the placement of your hands to make yourself more comfortable. 13. Hold this position for 5 seconds while you keep your back relaxed. 14. Slowly return to lying flat on the floor. Bridges Repeat these steps 10 times: 10. Lie on your back on a firm surface. 11. Bend your knees so they are pointing toward the ceiling and your feet are flat on the floor. 12. Tighten your buttocks muscles and lift your buttocks off of the floor until your waist is at almost the same height as your knees. You should feel the muscles working in your buttocks and the back of your thighs. If you do not feel these muscles, slide your feet 1-2 inches farther away from your buttocks. 13. Hold this position for 3-5 seconds. 14. Slowly lower your hips to the starting position, and allow your buttocks muscles to relax completely. If this exercise is too easy, try doing it with your arms crossed over your chest. Abdominal Crunches Repeat these steps 5-10 times: 8. Lie on your back on a firm bed or the floor with your legs extended. 9. Bend your knees so they are pointing toward  the ceiling and your feet are flat on the floor. 10. Cross your arms over your chest. 11. Tip your chin slightly toward your chest without bending your neck. 12. Tighten your abdominal muscles and slowly raise your trunk (torso) high enough to lift your shoulder blades a tiny bit off of the floor. Avoid raising your torso higher than that, because it can put too much stress on your low back and it does not help to strengthen your abdominal muscles. 13. Slowly return to your starting position. Back Lifts Repeat these steps 5-10 times: 10. Lie on your abdomen (face-down) with your arms at your sides, and rest your forehead on the floor. 11. Tighten the muscles in your legs and your buttocks. 12. Slowly lift your chest off of the floor while you keep your hips pressed to the floor. Keep the back of your head in line with the curve in your back. Your eyes should be looking at the floor. 13. Hold this position for 3-5 seconds. 14. Slowly return to your starting position. SEEK MEDICAL CARE IF:  Your back pain or discomfort gets much worse when you do an exercise.  Your back pain or discomfort does not lessen within 2 hours after you exercise. If you have any of these problems, stop doing these exercises right away. Do not do them again unless your health care provider says that you can. SEEK IMMEDIATE MEDICAL CARE IF:  You develop sudden, severe back pain. If this happens, stop doing the exercises right away. Do not do them again unless your health care provider says that you can.   This information is not intended to replace advice given to you by your health care provider. Make sure you discuss any questions you have with your health care provider.   Document Released: 06/21/2004 Document Revised: 02/02/2015 Document Reviewed: 07/08/2014 Elsevier Interactive Patient Education  2016 [employer].

## 2015-08-25 NOTE — Progress Notes (Signed)
Subjective:    Patient ID: Harold Morales, male    DOB: 17-Jun-1985, 30 y.o.   MRN: [id number]  HPI Harold Morales, a 30 year old male with a history of asthma and eczema present to establish care. Patient states that he has not had a primary provider since relocating to area. He has primarily been using the emergency department for all primary needs. He was evaluated in the emergency department on 08/10/2015 for a tonsilar abscess. Patient has a history of asthma.  Patient's symptoms include productive cough. Associated symptoms include nonproductive cough. The patient has been suffering from these symptoms for several weeks. Patient is also an everyday smoker. He smokes 0.5 packs per day. He has attempted to quit in the past without success. Symptoms have been improved slightly, but he had to use his rescue inhaler on yesterday for persistent coughing. He states that he has not been using Qvar consistently. . Suspected precipitants include infection and pollenst. Patient has required Emergency Room treatment for these symptoms, but has not required hospitalization. Patient also complains of an eczematous rash. Onset of symptoms was several years ago, and have been intermittent since that time.  Risk factors include a history of asthma. Treatment modalities that have been used in the past include: Kenolog cream and emmollients.   Past Medical History  Diagnosis Date  . Asthma   . Eczema    Social History   Social History  . Marital Status: Single    Spouse Name: N/A  . Number of Children: N/A  . Years of Education: N/A   Occupational History  . Not on file.   Social History Main Topics  . Smoking status: Current Every Day Smoker -- 0.50 packs/day    Types: Cigarettes  . Smokeless tobacco: Not on file  . Alcohol Use: Yes     Comment: occasionally  . Drug Use: Yes    Special: Marijuana  . Sexual Activity: Not on file   Other Topics Concern  . Not on file   Social History  Narrative   Immunization History  Administered Date(s) Administered  . Pneumococcal Polysaccharide-23 08/25/2015   Allergies  Allergen Reactions  . Shrimp [Shellfish Allergy] Anaphylaxis    Throat swelling   Review of Systems  Constitutional: Negative.   HENT: Negative.   Eyes: Negative.  Negative for visual disturbance.  Respiratory: Positive for shortness of breath. Negative for apnea.   Cardiovascular: Negative.  Negative for chest pain, palpitations and leg swelling.  Gastrointestinal: Negative.   Endocrine: Negative.  Negative for polydipsia, polyphagia and polyuria.  Genitourinary: Negative.   Musculoskeletal: Negative.   Skin: Positive for rash (eczematous).  Allergic/Immunologic: Negative.   Neurological: Negative.   Hematological: Negative.   Psychiatric/Behavioral: Negative.       Objective:   Physical Exam  Constitutional: He is oriented to person, place, and time. He appears well-developed and well-nourished.  HENT:  Head: Normocephalic and atraumatic.  Right Ear: External ear normal.  Left Ear: External ear normal.  Nose: Nose normal.  Mouth/Throat: Oropharynx is clear and moist.  Eyes: Conjunctivae and EOM are normal. Pupils are equal, round, and reactive to light.  Neck: Normal range of motion. Neck supple.  Cardiovascular: Normal rate, regular rhythm, normal heart sounds and intact distal pulses.   Pulmonary/Chest: Effort normal and breath sounds normal.  Abdominal: Soft. Bowel sounds are normal.  Musculoskeletal: Normal range of motion.  Neurological: He is alert and oriented to person, place, and time. He has normal reflexes.  Skin: Skin is warm and dry. Rash noted.  Extensor surfaces bilaterally rough to palpation, hyperpigmented.   Psychiatric: He has a normal mood and affect. His behavior is normal. Judgment and thought content normal.      BP 127/60 mmHg  Pulse 67  Temp(Src) 97.8 F (36.6 C) (Oral)  Resp 16  Ht 5' 9.5" (1.765 m)  Wt 228 lb  (103.42 kg)  BMI 33.20 kg/m2 Assessment & Plan:  1. Asthma, mild intermittent, uncomplicated - beclomethasone (QVAR) 40 MCG/ACT inhaler; Inhale 2 puffs into the lungs 2 (two) times daily.  Dispense: 1 Inhaler; Refill: 12 - albuterol (PROVENTIL HFA;VENTOLIN HFA) 108 (90 Base) MCG/ACT inhaler; Inhale 2 puffs into the lungs every 6 (six) hours as needed for wheezing or shortness of breath.  Dispense: 1 Inhaler; Refill: 3 - montelukast (SINGULAIR) 10 MG tablet; Take 1 tablet (10 mg total) by mouth at bedtime.  Dispense: 30 tablet; Refill: 12  2. Eczema Recommend Dove Unscented [employer] and lukewarm water. Blot dry after shower.  - triamcinolone cream (KENALOG) 0.1 %; Apply 1 application topically 3 (three) times daily.  Dispense: 30 g; Refill: 5  3. Chronic back pain - meloxicam (MOBIC) 7.5 MG tablet; Take 1 tablet (7.5 mg total) by mouth daily.  Dispense: 30 tablet; Refill: 0  4. Obesity Recommend a lowfat, low carbohydrate diet divided over 5-6 small meals, increase water intake to 6-8 glasses, and 150 minutes per week of cardiovascular exercise.   - HgB A1c - TSH - POCT urinalysis dip (device)  5. Leukocytosis - CBC with Differential  6. Immunization due - Pneumococcal polysaccharide vaccine 23-valent greater than or equal to 2yo subcutaneous/IM  RTC: Will follow up for asthma in 3 months. Will follow up by phone with laboratory results.     Korrie Hofbauer M, FNP    The patient was given clear instructions to go to ER or return to medical center if symptoms do not improve, worsen or new problems develop. The patient verbalized understanding. Will notify patient with laboratory results.

## 2015-08-26 LAB — T3, FREE: T3 FREE: 2.9 pg/mL (ref 2.3–4.2)

## 2015-08-26 LAB — T4: T4 TOTAL: 6.7 ug/dL (ref 4.5–12.0)

## 2015-10-25 MED FILL — ?MONTELUKAST SOD 10 MG TAB: 10 | 30 days supply | Qty: 30 | Fill #1

## 2015-10-25 MED FILL — VENTOLIN HFA 90 MCG INHALER: 108 (90 BAS | 27 days supply | Qty: 18 | Fill #1

## 2015-10-25 MED FILL — !QVAR 40 MCG ORAL INHALER: 40 MCG | 30 days supply | Qty: 1 | Fill #1

## 2015-10-25 MED FILL — TRIAMCINOLONE 0.1% CREAM: 0.1 | 10 days supply | Qty: 30 | Fill #1

## 2015-10-29 ENCOUNTER — Encounter (HOSPITAL_COMMUNITY): Payer: Self-pay | Admitting: Emergency Medicine

## 2015-10-29 ENCOUNTER — Emergency Department (HOSPITAL_COMMUNITY)
Admission: EM | Admit: 2015-10-29 | Discharge: 2015-10-29 | Disposition: A | Discharge status: Home / Self Care | Payer: No Typology Code available for payment source | Attending: Emergency Medicine | Admitting: Emergency Medicine

## 2015-10-29 ENCOUNTER — Emergency Department (HOSPITAL_COMMUNITY): Payer: No Typology Code available for payment source

## 2015-10-29 DIAGNOSIS — Y998 Other external cause status: Secondary | ICD-10-CM | POA: Insufficient documentation

## 2015-10-29 DIAGNOSIS — J45909 Unspecified asthma, uncomplicated: Secondary | ICD-10-CM | POA: Insufficient documentation

## 2015-10-29 DIAGNOSIS — Y9289 Other specified places as the place of occurrence of the external cause: Secondary | ICD-10-CM | POA: Insufficient documentation

## 2015-10-29 DIAGNOSIS — F17210 Nicotine dependence, cigarettes, uncomplicated: Secondary | ICD-10-CM | POA: Insufficient documentation

## 2015-10-29 DIAGNOSIS — S62316A Displaced fracture of base of fifth metacarpal bone, right hand, initial encounter for closed fracture: Secondary | ICD-10-CM | POA: Insufficient documentation

## 2015-10-29 DIAGNOSIS — Y9389 Activity, other specified: Secondary | ICD-10-CM | POA: Insufficient documentation

## 2015-10-29 DIAGNOSIS — W2209XA Striking against other stationary object, initial encounter: Secondary | ICD-10-CM | POA: Insufficient documentation

## 2015-10-29 DIAGNOSIS — Z7952 Long term (current) use of systemic steroids: Secondary | ICD-10-CM | POA: Insufficient documentation

## 2015-10-29 DIAGNOSIS — Z872 Personal history of diseases of the skin and subcutaneous tissue: Secondary | ICD-10-CM | POA: Insufficient documentation

## 2015-10-29 DIAGNOSIS — S62309A Unspecified fracture of unspecified metacarpal bone, initial encounter for closed fracture: Secondary | ICD-10-CM

## 2015-10-29 DIAGNOSIS — Z7951 Long term (current) use of inhaled steroids: Secondary | ICD-10-CM | POA: Insufficient documentation

## 2015-10-29 DIAGNOSIS — Z79899 Other long term (current) drug therapy: Secondary | ICD-10-CM | POA: Insufficient documentation

## 2015-10-29 MED ORDER — OXYCODONE-ACETAMINOPHEN 5-325 MG PO TABS: 1.0000 | ORAL_TABLET | Freq: Four times a day (QID) | ORAL | Status: AC | PRN

## 2015-10-29 MED ORDER — OXYCODONE-ACETAMINOPHEN 5-325 MG PO TABS
1.0000 | ORAL_TABLET | Freq: Once | ORAL | Status: AC
  Administered 2015-10-29: 1 via ORAL
  Filled 2015-10-29: qty 1

## 2015-10-29 NOTE — ED Notes (Signed)
Pt punched a wall around 6:45pm.  C/o pain and swelling to R hand.

## 2015-10-29 NOTE — ED Provider Notes (Signed)
CSN: [id number]     Arrival date & time 10/29/15  0001 History  By signing my name below, I, Phillis HaggisGabriella Gaje, attest that this documentation has been prepared under the direction and in the presence of Benjiman CoreNathan Jamisen Hawes, MD. Electronically Signed: Phillis HaggisGabriella Gaje, ED Scribe. 10/29/2015. 2:30 AM.  Chief Complaint  Patient presents with  . Hand Pain   The history is provided by the patient. No language interpreter was used.  HPI Comments: Harold Morales is a 30 y.o. male who presents to the Emergency Department complaining of right hand pain and swelling onset 8 hours ago. Pt reports pain began after he punched a wall. He states that the wall broke after he punched it. He denies other injury, numbness or weakness.   Past Medical History  Diagnosis Date  . Asthma   . Eczema    Past Surgical History  Procedure Laterality Date  . Appendectomy     Family History  Problem Relation Age of Onset  . Hypertension Maternal Grandfather    Social History  Substance Use Topics  . Smoking status: Current Every Day Smoker -- 0.50 packs/day    Types: Cigarettes  . Smokeless tobacco: None  . Alcohol Use: Yes     Comment: occasionally    Review of Systems  Constitutional: Negative for appetite change.  Musculoskeletal: Positive for arthralgias. Negative for back pain and neck pain.  Neurological: Negative for weakness and numbness.      Allergies  Shrimp  Home Medications   Prior to Admission medications   Medication Sig Start Date End Date Taking? Authorizing Provider  albuterol (PROVENTIL HFA;VENTOLIN HFA) 108 (90 Base) MCG/ACT inhaler Inhale 2 puffs into the lungs every 6 (six) hours as needed for wheezing or shortness of breath. 08/25/15   Massie MaroonLachina M Hollis, FNP  beclomethasone (QVAR) 40 MCG/ACT inhaler Inhale 2 puffs into the lungs 2 (two) times daily. 08/25/15   Massie MaroonLachina M Hollis, FNP  diphenhydrAMINE (SOMINEX) 25 MG tablet Take 50 mg by mouth daily as needed for allergies. Reported on  08/25/2015    Historical Provider, MD  meloxicam (MOBIC) 7.5 MG tablet Take 1 tablet (7.5 mg total) by mouth daily. 08/25/15   Massie MaroonLachina M Hollis, FNP  montelukast (SINGULAIR) 10 MG tablet Take 1 tablet (10 mg total) by mouth at bedtime. 08/25/15   Massie MaroonLachina M Hollis, FNP  nicotine (NICODERM CQ) 7 mg/24hr patch Place 1 patch (7 mg total) onto the skin daily. Patient not taking: Reported on 09/23/2014 09/21/14   Jaclyn ShaggyEnobong Amao, MD  oxyCODONE-acetaminophen (PERCOCET/ROXICET) 5-325 MG tablet Take 1-2 tablets by mouth every 6 (six) hours as needed for severe pain. 10/29/15   Benjiman CoreNathan Tinya Cadogan, MD  triamcinolone cream (KENALOG) 0.1 % Apply 1 application topically 3 (three) times daily. 08/25/15   Massie MaroonLachina M Hollis, FNP   BP 134/82 mmHg  Pulse 69  Temp(Src) 98.9 F (37.2 C) (Oral)  Resp 18  SpO2 100% Physical Exam  Constitutional: He appears well-developed and well-nourished.  HENT:  Head: Normocephalic and atraumatic.  Musculoskeletal: Normal range of motion.  Tenderness over the proximal right 5th metacarpal, swelling over the medial aspect of the hand, no tenderness over elbow, strong radial pulse, skin is intact  Neurological: He is alert.  Skin: Skin is warm.  Nursing note and vitals reviewed.   ED Course  Procedures (including critical care time) DIAGNOSTIC STUDIES: Oxygen Saturation is 100% on RA, normal by my interpretation.    COORDINATION OF CARE: 2:30 AM-Discussed treatment plan which includes x-ray with pt  at bedside and pt agreed to plan.    Labs Review Labs Reviewed - No data to display  Imaging Review Dg Hand Complete Right  10/29/2015  CLINICAL DATA:  Punched wall today with swelling along the fifth metacarpal on the right. Initial encounter. EXAM: RIGHT HAND - COMPLETE 3+ VIEW COMPARISON:  None. FINDINGS: Fifth metacarpal base fracture with transverse component and perpendicular longitudinal component extending to the [facility] joint. Proximal medial fragment is distracted and mildly  rotated. IMPRESSION: Fifth metacarpal base fracture as described. Electronically Signed   By: Marnee Spring M.D.   On: 10/29/2015 01:23   I have personally reviewed and evaluated these images and lab results as part of my medical decision-making.   EKG Interpretation None      MDM   Final diagnoses:  Metacarpal bone fracture, closed, initial encounter  Patient with hand fracture. Closed. Will have follow-up with hand surgery. I personally performed the services described in this documentation, which was scribed in my presence. The recorded information has been reviewed and is accurate.     Benjiman Core, MD 10/29/15 [phone]

## 2015-10-29 NOTE — Discharge Instructions (Signed)
Metacarpal Fracture °A metacarpal fracture is a break (fracture) of a bone in the hand. Metacarpals are the bones that extend from your knuckles to your wrist. In each hand, you have five metacarpal bones that connect your fingers and your thumb to your wrist. °Some hand fractures have bone pieces that are close together and stable (simple). These fractures may be treated with only a splint or cast. Hand fractures that have many pieces of broken bone (comminuted), unstable bone pieces (displaced), or a bone that breaks through the skin (compound) usually require surgery. °CAUSES °This injury may be caused by: °· A fall. °· A hard, direct hit to your hand. °· An injury that squeezes your knuckle, stretches your finger out of place, or crushes your hand. °RISK FACTORS °This injury is more likely to occur if: °· You play contact sports. °· You have certain bone diseases. °SYMPTOMS  °Symptoms of this type of fracture develop soon after the injury. Symptoms may include: °· Swelling. °· Pain. °· Stiffness. °· Increased pain with movement. °· Bruising. °· Inability to move a finger. °· A shortened finger. °· A finger knuckle that looks sunken in. °· Unusual appearance of the hand or finger (deformity). °DIAGNOSIS  °This injury may be diagnosed based on your signs and symptoms, especially if you had a recent hand injury. Your health care provider will perform a physical exam. He or she may also order X-rays to confirm the diagnosis.  °TREATMENT  °Treatment for this injury depends on the type of fracture you have and how severe it is. Possible treatments include: °· Non-reduction. This can be done if the bone does not need to be moved back into place. The fracture can be casted or splinted as it is.   °· Closed reduction. If your bone is stable and can be moved back into place, you may only need to wear a cast or splint or have buddy taping. °· Closed reduction with internal fixation (CRIF). This is the most common  treatment. You may have this procedure if your bone can be moved back into place but needs more support. Wires, pins, or screws may be inserted through your skin to stabilize the fracture. °· Open reduction with internal fixation (ORIF). This may be needed if your fracture is severe and unstable. It involves surgery to move your bone back into the right position. Screws, wires, or plates are used to stabilize the fracture. °After all procedures, you may need to wear a cast or a splint for several weeks. You will also need to have follow-up X-rays to make sure that the bone is healing well and staying in position. After you no longer need your cast or splint, you may need physical therapy. This will help you to regain full movement and strength in your hand.  °HOME CARE INSTRUCTIONS  °If You Have a Cast: °· Do not stick anything inside the cast to scratch your skin. Doing that increases your risk of infection. °· Check the skin around the cast every day. Report any concerns to your health care provider. You may put lotion on dry skin around the edges of the cast. Do not apply lotion to the skin underneath the cast. °If You Have a Splint: °· Wear it as directed by your health care provider. Remove it only as directed by your health care provider. °· Loosen the splint if your fingers become numb and tingle, or if they turn cold and blue. °Bathing °· Cover the cast or splint with a   watertight plastic bag to protect it from water while you take a bath or a shower. Do not let the cast or splint get wet. °Managing Pain, Stiffness, and Swelling °· If directed, apply ice to the injured area (if you have a splint, not a cast): °¨ Put ice in a plastic bag. °¨ Place a towel between your skin and the bag. °¨ Leave the ice on for 20 minutes, 2-3 times a day. °· Move your fingers often to avoid stiffness and to lessen swelling. °· Raise the injured area above the level of your heart while you are sitting or lying  down. °Driving °· Do not drive or operate heavy machinery while taking pain medicine. °· Do not drive while wearing a cast or splint on a hand that you use for driving. °Activity °· Return to your normal activities as directed by your health care provider. Ask your health care provider what activities are safe for you. °General Instructions °· Do not put pressure on any part of the cast or splint until it is fully hardened. This may take several hours. °· Keep the cast or splint clean and dry. °· Do not use any tobacco products, including cigarettes, chewing tobacco, or electronic cigarettes. Tobacco can delay bone healing. If you need help quitting, ask your health care provider. °· Take medicines only as directed by your health care provider. °· Keep all follow-up visits as directed by your health care provider. This is important. °SEEK MEDICAL CARE IF:  °· Your pain is getting worse. °· You have redness, swelling, or pain in the injured area.   °· You have fluid, blood, or pus coming from under your cast or splint.   °· You notice a bad smell coming from under your cast or splint.   °· You have a fever.   °SEEK IMMEDIATE MEDICAL CARE IF:  °· You develop a rash.   °· You have trouble breathing.   °· Your skin or nails on your injured hand turn blue or gray even after you loosen your splint. °· Your injured hand feels cold or becomes numb even after you loosen your splint.   °· You develop severe pain under the cast or in your hand. °  °This information is not intended to replace advice given to you by your health care provider. Make sure you discuss any questions you have with your health care provider. °  °Document Released: 05/14/2005 Document Revised: 02/02/2015 Document Reviewed: 03/03/2014 °Elsevier Interactive Patient Education ©2016 [employer] ° °

## 2015-10-29 NOTE — ED Notes (Signed)
The pt has an injured rt hand.  He struck a wall yesterday  Swelling over his rt fifth metacarpal.

## 2015-11-24 ENCOUNTER — Ambulatory Visit (INDEPENDENT_AMBULATORY_CARE_PROVIDER_SITE_OTHER): Payer: No Typology Code available for payment source | Admitting: Family Medicine

## 2015-11-24 ENCOUNTER — Encounter: Payer: Self-pay | Admitting: Family Medicine

## 2015-11-24 VITALS — BP 129/66 | HR 66 | Temp 98.4°F | Resp 16 | Ht 69.5 in | Wt 216.0 lb

## 2015-11-24 DIAGNOSIS — L309 Dermatitis, unspecified: Secondary | ICD-10-CM

## 2015-11-24 DIAGNOSIS — F17200 Nicotine dependence, unspecified, uncomplicated: Secondary | ICD-10-CM

## 2015-11-24 DIAGNOSIS — Z202 Contact with and (suspected) exposure to infections with a predominantly sexual mode of transmission: Secondary | ICD-10-CM

## 2015-11-24 DIAGNOSIS — J4520 Mild intermittent asthma, uncomplicated: Secondary | ICD-10-CM | POA: Insufficient documentation

## 2015-11-24 DIAGNOSIS — R7989 Other specified abnormal findings of blood chemistry: Secondary | ICD-10-CM

## 2015-11-24 LAB — TSH: TSH: 0.9 m[IU]/L (ref 0.40–4.50)

## 2015-11-24 MED ORDER — MONTELUKAST SODIUM 10 MG PO TABS: 10.0000 mg | ORAL_TABLET | Freq: Every day | ORAL | Status: AC

## 2015-11-24 MED ORDER — TRIAMCINOLONE ACETONIDE 0.1 % EX CREA: 1.0000 "application " | TOPICAL_CREAM | Freq: Three times a day (TID) | CUTANEOUS | Status: AC

## 2015-11-24 NOTE — Patient Instructions (Signed)
Asthma, Adult Asthma is a recurring condition in which the airways tighten and narrow. Asthma can make it difficult to breathe. It can cause coughing, wheezing, and shortness of breath. Asthma episodes, also called asthma attacks, range from minor to life-threatening. Asthma cannot be cured, but medicines and lifestyle changes can help control it. CAUSES Asthma is believed to be caused by inherited (genetic) and environmental factors, but its exact cause is unknown. Asthma may be triggered by allergens, lung infections, or irritants in the air. Asthma triggers are different for each person. Common triggers include:   Animal dander.  Dust mites.  Cockroaches.  Pollen from trees or grass.  Mold.  Smoke.  Air pollutants such as dust, household cleaners, hair sprays, aerosol sprays, paint fumes, strong chemicals, or strong odors.  Cold air, weather changes, and winds (which increase molds and pollens in the air).  Strong emotional expressions such as crying or laughing hard.  Stress.  Certain medicines (such as aspirin) or types of drugs (such as beta-blockers).  Sulfites in foods and drinks. Foods and drinks that may contain sulfites include dried fruit, potato chips, and sparkling grape juice.  Infections or inflammatory conditions such as the flu, a cold, or an inflammation of the nasal membranes (rhinitis).  Gastroesophageal reflux disease (GERD).  Exercise or strenuous activity. SYMPTOMS Symptoms may occur immediately after asthma is triggered or many hours later. Symptoms include:  Wheezing.  Excessive nighttime or early morning coughing.  Frequent or severe coughing with a common cold.  Chest tightness.  Shortness of breath. DIAGNOSIS  The diagnosis of asthma is made by a review of your medical history and a physical exam. Tests may also be performed. These may include:  Lung function studies. These tests show how much air you breathe in and out.  Allergy  tests.  Imaging tests such as X-rays. TREATMENT  Asthma cannot be cured, but it can usually be controlled. Treatment involves identifying and avoiding your asthma triggers. It also involves medicines. There are 2 classes of medicine used for asthma treatment:   Controller medicines. These prevent asthma symptoms from occurring. They are usually taken every day.  Reliever or rescue medicines. These quickly relieve asthma symptoms. They are used as needed and provide short-term relief. Your health care provider will help you create an asthma action plan. An asthma action plan is a written plan for managing and treating your asthma attacks. It includes a list of your asthma triggers and how they may be avoided. It also includes information on when medicines should be taken and when their dosage should be changed. An action plan may also involve the use of a device called a peak flow meter. A peak flow meter measures how well the lungs are working. It helps you monitor your condition. HOME CARE INSTRUCTIONS   Take medicines only as directed by your health care provider. Speak with your health care provider if you have questions about how or when to take the medicines.  Use a peak flow meter as directed by your health care provider. Record and keep track of readings.  Understand and use the action plan to help minimize or stop an asthma attack without needing to seek medical care.  Control your home environment in the following ways to help prevent asthma attacks:  Do not smoke. Avoid being exposed to secondhand smoke.  Change your heating and air conditioning filter regularly.  Limit your use of fireplaces and wood stoves.  Get rid of pests (such as roaches   and mice) and their droppings.  Throw away plants if you see mold on them.  Clean your floors and dust regularly. Use unscented cleaning products.  Try to have someone else vacuum for you regularly. Stay out of rooms while they are  being vacuumed and for a short while afterward. If you vacuum, use a dust mask from a hardware store, a double-layered or microfilter vacuum cleaner bag, or a vacuum cleaner with a HEPA filter.  Replace carpet with wood, tile, or vinyl flooring. Carpet can trap dander and dust.  Use allergy-proof pillows, mattress covers, and box spring covers.  Wash bed sheets and blankets every week in hot water and dry them in a dryer.  Use blankets that are made of polyester or cotton.  Clean bathrooms and kitchens with bleach. If possible, have someone repaint the walls in these rooms with mold-resistant paint. Keep out of the rooms that are being cleaned and painted.  Wash hands frequently. SEEK MEDICAL CARE IF:   You have wheezing, shortness of breath, or a cough even if taking medicine to prevent attacks.  The colored mucus you cough up (sputum) is thicker than usual.  Your sputum changes from clear or white to yellow, green, gray, or bloody.  You have any problems that may be related to the medicines you are taking (such as a rash, itching, swelling, or trouble breathing).  You are using a reliever medicine more than 2-3 times per week.  Your peak flow is still at 50-79% of your personal best after following your action plan for 1 hour.  You have a fever. SEEK IMMEDIATE MEDICAL CARE IF:   You seem to be getting worse and are unresponsive to treatment during an asthma attack.  You are short of breath even at rest.  You get short of breath when doing very little physical activity.  You have difficulty eating, drinking, or talking due to asthma symptoms.  You develop chest pain.  You develop a fast heartbeat.  You have a bluish color to your lips or fingernails.  You are light-headed, dizzy, or faint.  Your peak flow is less than 50% of your personal best.   This information is not intended to replace advice given to you by your health care provider. Make sure you discuss any  questions you have with your health care provider.   Document Released: 05/14/2005 Document Revised: 02/02/2015 Document Reviewed: 12/11/2012 Elsevier Interactive Patient Education 2016 [employer]  

## 2015-11-24 NOTE — Progress Notes (Signed)
Subjective:    Patient ID: Harold Morales, male    DOB: 10/14/1985, 30 y.o.   MRN: [id number]  HPI Mr. Harold Morales, a 30 year old male with a history of asthma and eczema presents for a 1 month follow up.  Patient has a history of asthma.  Patient's symptoms include productive cough. Associated symptoms include nonproductive cough and occasional allergy symptoms. He says that he has not been taking QVar or singulair consistently over the past month.  The patient has been suffering from these symptoms for several weeks. Patient is also an everyday smoker. He smokes 0.5 packs per day. He has attempted to quit in the past without success. Symptoms have been improved slightly, but he had to use his rescue inhaler on yesterday for persistent coughing. Suspected precipitants include infection and pollens.  Patient has required Emergency Room treatment for these symptoms, but has not required hospitalization. Patient also complains of an eczematous rash. Onset of symptoms was several years ago, and have been intermittent since that time.  Risk factors include a history of asthma. Treatment modalities that have been used in the past include: Kenolog cream and emmollients.   Mr. Harold Morales is also complaining of possible exposure to sexually transmitted diseases. He is asymptomatic at present. He says that he has not been using barrier protection over the past several weeks. He denies fatigue, fever, dysuria, or genital lesions.   Past Medical History  Diagnosis Date  . Asthma   . Eczema    Social History   Social History  . Marital Status: Single    Spouse Name: N/A  . Number of Children: N/A  . Years of Education: N/A   Occupational History  . Not on file.   Social History Main Topics  . Smoking status: Current Every Day Smoker -- 0.50 packs/day    Types: Cigarettes  . Smokeless tobacco: Not on file  . Alcohol Use: Yes     Comment: occasionally  . Drug Use: Yes    Special: Marijuana  .  Sexual Activity: Not on file   Other Topics Concern  . Not on file   Social History Narrative   Immunization History  Administered Date(s) Administered  . Pneumococcal Polysaccharide-23 08/25/2015   Allergies  Allergen Reactions  . Shrimp [Shellfish Allergy] Anaphylaxis    Throat swelling   Review of Systems  Constitutional: Negative.   HENT: Negative.   Eyes: Negative.  Negative for photophobia and visual disturbance.  Respiratory: Negative for apnea.   Cardiovascular: Negative.  Negative for chest pain, palpitations and leg swelling.  Gastrointestinal: Negative.   Endocrine: Negative.  Negative for polydipsia, polyphagia and polyuria.  Genitourinary: Negative.   Musculoskeletal: Negative.   Skin: Positive for rash (eczematous).  Allergic/Immunologic: Positive for environmental allergies.  Neurological: Negative.   Hematological: Negative.   Psychiatric/Behavioral: Negative.       Objective:   Physical Exam  Constitutional: He is oriented to person, place, and time. He appears well-developed and well-nourished.  HENT:  Head: Normocephalic and atraumatic.  Right Ear: External ear normal.  Left Ear: External ear normal.  Nose: Nose normal.  Mouth/Throat: Oropharynx is clear and moist.  Eyes: Conjunctivae and EOM are normal. Pupils are equal, round, and reactive to light.  Neck: Normal range of motion. Neck supple.  Cardiovascular: Normal rate, regular rhythm, normal heart sounds and intact distal pulses.   Pulmonary/Chest: Effort normal and breath sounds normal.  Abdominal: Soft. Bowel sounds are normal.  Musculoskeletal: Normal range of motion.  Neurological: He is alert and oriented to person, place, and time. He has normal reflexes.  Skin: Skin is warm and dry. Rash noted.  Extensor surfaces bilaterally rough to palpation, hyperpigmented.   Psychiatric: He has a normal mood and affect. His behavior is normal. Judgment and thought content normal.      BP 129/66  mmHg  Pulse 66  Temp(Src) 98.4 F (36.9 C) (Oral)  Resp 16  Ht 5' 9.5" (1.765 m)  Wt 216 lb (97.977 kg)  BMI 31.45 kg/m2  SpO2 98% Assessment & Plan:  1. Asthma, mild intermittent, uncomplicated - montelukast (SINGULAIR) 10 MG tablet; Take 1 tablet (10 mg total) by mouth at bedtime.  Dispense: 30 tablet; Refill: 12  2. Eczema - triamcinolone cream (KENALOG) 0.1 %; Apply 1 application topically 3 (three) times daily.  Dispense: 80 g; Refill: 5  3. Abnormal TSH - TSH  4. Tobacco dependence Smoking cessation instruction/counseling given:  counseled patient on the dangers of tobacco use, advised patient to stop smoking, and reviewed strategies to maximize success   5. Possible exposure to STD Recommended barrier protection with sexual intercourse.  - RPR - GC/Chlamydia Probe Amp - HIV antibody (with reflex)   RTC: Will follow up for asthma in 6 months. Will follow up by phone with laboratory results.     Oriyah Lamphear M, FNP    The patient was given clear instructions to go to ER or return to medical center if symptoms do not improve, worsen or new problems develop. The patient verbalized understanding. Will notify patient with laboratory results.

## 2015-11-25 ENCOUNTER — Telehealth: Payer: Self-pay

## 2015-11-25 LAB — GC/CHLAMYDIA PROBE AMP
CT PROBE, AMP APTIMA: NOT DETECTED
GC PROBE AMP APTIMA: NOT DETECTED

## 2015-11-25 LAB — RPR

## 2015-11-25 LAB — HIV ANTIBODY (ROUTINE TESTING W REFLEX): HIV 1&2 Ab, 4th Generation: NONREACTIVE

## 2015-11-25 NOTE — Telephone Encounter (Signed)
-----   Message from Massie MaroonLachina M Hollis, [place] sent at 11/25/2015 12:18 PM EDT ----- Regarding: lab results Please inform Harold Morales that all laboratory values are within a normal range. Please follow up in office as scheduled.  Thanks  ----- Message -----    From: Lab in [facility] Interface    Sent: 11/24/2015   9:56 PM      To: Massie MaroonLachina M Hollis, FNP

## 2015-11-25 NOTE — Telephone Encounter (Signed)
Patient called back in regards to a phone call he recvd. I explained that Harold Morales was calling him back to advise of lab results. / ----- Message from Massie MaroonLachina M Hollis, FNP sent at 11/25/2015 12:18 PM EDT ----- Regarding: lab results Please inform Harold Morales that all laboratory values are within a normal range. Please follow up in office as scheduled. / Patient verbalized understanding.

## 2015-11-25 NOTE — Telephone Encounter (Signed)
Called, patient was unavaliable. Spoke with mother asked her to have patient call back at his convince. Thanks!

## 2015-12-15 MED FILL — !VENTOLIN HFA INHALER: 108 (90 BAS | 27 days supply | Qty: 18 | Fill #2

## 2015-12-15 MED FILL — TRIAMCINOLONE 0.1% CREAM: 0.1 | 10 days supply | Qty: 30 | Fill #2

## 2016-01-16 MED FILL — ?MONTELUKAST SOD 10 MG TAB: 10 | 30 days supply | Qty: 30 | Fill #2

## 2016-01-16 MED FILL — TRIAMCINOLONE 0.1% CREAM: 0.1 | 10 days supply | Qty: 30 | Fill #3

## 2016-01-16 MED FILL — !VENTOLIN HFA INHALER: 108 (90 BAS | 27 days supply | Qty: 18 | Fill #3

## 2016-01-18 ENCOUNTER — Emergency Department (HOSPITAL_COMMUNITY)
Admission: EM | Admit: 2016-01-18 | Discharge: 2016-01-18 | Disposition: A | Discharge status: Home / Self Care | Payer: No Typology Code available for payment source | Attending: Emergency Medicine | Admitting: Emergency Medicine

## 2016-01-18 ENCOUNTER — Encounter (HOSPITAL_COMMUNITY): Payer: Self-pay | Admitting: Emergency Medicine

## 2016-01-18 DIAGNOSIS — Z79899 Other long term (current) drug therapy: Secondary | ICD-10-CM | POA: Insufficient documentation

## 2016-01-18 DIAGNOSIS — R300 Dysuria: Secondary | ICD-10-CM

## 2016-01-18 DIAGNOSIS — Z711 Person with feared health complaint in whom no diagnosis is made: Secondary | ICD-10-CM

## 2016-01-18 DIAGNOSIS — J45909 Unspecified asthma, uncomplicated: Secondary | ICD-10-CM | POA: Insufficient documentation

## 2016-01-18 DIAGNOSIS — Z202 Contact with and (suspected) exposure to infections with a predominantly sexual mode of transmission: Secondary | ICD-10-CM | POA: Insufficient documentation

## 2016-01-18 DIAGNOSIS — R369 Urethral discharge, unspecified: Secondary | ICD-10-CM

## 2016-01-18 DIAGNOSIS — F17210 Nicotine dependence, cigarettes, uncomplicated: Secondary | ICD-10-CM | POA: Insufficient documentation

## 2016-01-18 LAB — URINALYSIS, ROUTINE W REFLEX MICROSCOPIC
GLUCOSE, UA: NEGATIVE mg/dL
KETONES UR: 15 mg/dL — AB
Nitrite: NEGATIVE
PH: 6 (ref 5.0–8.0)
Protein, ur: 30 mg/dL — AB
Specific Gravity, Urine: 1.038 — ABNORMAL HIGH (ref 1.005–1.030)

## 2016-01-18 LAB — URINE MICROSCOPIC-ADD ON

## 2016-01-18 MED ORDER — CEFTRIAXONE SODIUM 250 MG IJ SOLR
250.0000 mg | Freq: Once | INTRAMUSCULAR | Status: AC
  Administered 2016-01-18: 250 mg via INTRAMUSCULAR
  Filled 2016-01-18: qty 250

## 2016-01-18 MED ORDER — AZITHROMYCIN 250 MG PO TABS
1000.0000 mg | ORAL_TABLET | Freq: Once | ORAL | Status: AC
  Administered 2016-01-18: 1000 mg via ORAL
  Filled 2016-01-18: qty 4

## 2016-01-18 NOTE — Discharge Instructions (Signed)
Read the information below.   You were treated in the ED for possible sexually transmitted infection.  It is important that you notify your sexual partners so they can be tested and treated as well.  You can follow up with your primary doctor or health department for future STI checks.  It can take 2-3 days to receive cultures, you will be notified if abnormal.  To decrease risk of sexually transmitted infections be sure to use barrier protection such as condoms every time.  You may return to the Emergency Department at any time for worsening condition or any new symptoms that concern you. Return to ED if you develop fever, nausea, vomiting, rectal pain, or scrotal pain.

## 2016-01-18 NOTE — ED Provider Notes (Signed)
[facility]-EMERGENCY DEPT Provider Note   CSN: [id number] Arrival date & time: 01/18/16  [id number]     History   Chief Complaint Chief Complaint  Patient presents with  . Penile Discharge    HPI Harold Morales is a 30 y.o. male.  Harold Morales is a 30 y.o. male with history of asthma and eczema presents to ED with complaint of penile discharge. Patient reports unprotected sexual encounter with male partner on Saturday. This morning had white penile discharge and dysuria. Prompting patient to come to ED for evaluation and STD treatment. Denies fever, hematuria, penile pain, scrotal pain, scrotal swelling, fever, chills, diaphoresis, nausea, vomiting, abdominal pain, or rashes/lesions.       Past Medical History:  Diagnosis Date  . Asthma   . Eczema     Patient Active Problem List   Diagnosis Date Noted  . Asthma, mild intermittent 11/24/2015  . Eczema 11/24/2015  . Tobacco dependence 11/24/2015  . Elevated blood pressure 09/21/2014  . Tobacco abuse 09/21/2014  . Asthmatic bronchitis with exacerbation 09/12/2014  . Dyspnea   . Elevated lactic acid level     Past Surgical History:  Procedure Laterality Date  . APPENDECTOMY         Home Medications    Prior to Admission medications   Medication Sig Start Date End Date Taking? Authorizing Provider  albuterol (PROVENTIL HFA;VENTOLIN HFA) 108 (90 Base) MCG/ACT inhaler Inhale 2 puffs into the lungs every 6 (six) hours as needed for wheezing or shortness of breath. 08/25/15   Massie MaroonLachina M Hollis, FNP  beclomethasone (QVAR) 40 MCG/ACT inhaler Inhale 2 puffs into the lungs 2 (two) times daily. 08/25/15   Massie MaroonLachina M Hollis, FNP  diphenhydrAMINE (SOMINEX) 25 MG tablet Take 50 mg by mouth daily as needed for allergies. Reported on 08/25/2015    Historical Provider, MD  meloxicam (MOBIC) 7.5 MG tablet Take 1 tablet (7.5 mg total) by mouth daily. 08/25/15   Massie MaroonLachina M Hollis, FNP  montelukast (SINGULAIR) 10 MG tablet Take 1 tablet (10 mg  total) by mouth at bedtime. 11/24/15   Massie MaroonLachina M Hollis, FNP  nicotine (NICODERM CQ) 7 mg/24hr patch Place 1 patch (7 mg total) onto the skin daily. Patient not taking: Reported on 09/23/2014 09/21/14   Jaclyn ShaggyEnobong Amao, MD  oxyCODONE-acetaminophen (PERCOCET/ROXICET) 5-325 MG tablet Take 1-2 tablets by mouth every 6 (six) hours as needed for severe pain. 10/29/15   Benjiman CoreNathan Pickering, MD  triamcinolone cream (KENALOG) 0.1 % Apply 1 application topically 3 (three) times daily. 11/24/15   Massie MaroonLachina M Hollis, FNP    Family History Family History  Problem Relation Age of Onset  . Hypertension Maternal Grandfather     Social History Social History  Substance Use Topics  . Smoking status: Current Every Day Smoker    Packs/day: 0.50    Types: Cigarettes  . Smokeless tobacco: Never Used  . Alcohol use Yes     Comment: occasionally     Allergies   Shrimp [shellfish allergy]   Review of Systems Review of Systems  Constitutional: Negative for chills, diaphoresis and fever.  Gastrointestinal: Negative for abdominal pain, constipation, diarrhea, nausea and vomiting.  Genitourinary: Positive for discharge and dysuria. Negative for hematuria, penile pain, penile swelling, scrotal swelling and testicular pain.  Skin: Negative for rash.     Physical Exam Updated Vital Signs BP 133/62 (BP Location: Left Arm)   Pulse 92   Temp 98.2 F (36.8 C) (Oral)   Resp 16   Ht 5\' 9"  (1.753 m)  Wt 95.7 kg   SpO2 99%   BMI 31.16 kg/m   Physical Exam  Constitutional: He appears well-developed and well-nourished. No distress.  HENT:  Head: Normocephalic and atraumatic.  Eyes: Conjunctivae are normal. No scleral icterus.  Neck: Normal range of motion.  Cardiovascular: Normal rate, regular rhythm and normal heart sounds.   No murmur heard. Pulmonary/Chest: Effort normal and breath sounds normal. No respiratory distress.  Abdominal: Soft. Bowel sounds are normal. There is no tenderness. There is no rebound  and no guarding.  Genitourinary: Testes normal. Right testis shows no mass, no swelling and no tenderness. Left testis shows no mass, no swelling and no tenderness. Circumcised. No penile tenderness. No discharge found.  Genitourinary Comments: Chaperone present for duration of exam. No rashes or lesions noted. No TTP of scrotom/testes/penis. No masses palpated. No penile discharge.   Neurological: He is alert.  Skin: Skin is warm and dry. He is not diaphoretic.  Psychiatric: He has a normal mood and affect. His behavior is normal.     ED Treatments / Results  Labs (all labs ordered are listed, but only abnormal results are displayed) Labs Reviewed  URINALYSIS, ROUTINE W REFLEX MICROSCOPIC ([facility])  GC/CHLAMYDIA PROBE AMP ([place]) NOT AT [facility]    EKG  EKG Interpretation None       Radiology No results found.  Procedures Procedures (including critical care time)  Medications Ordered in ED Medications  azithromycin (ZITHROMAX) tablet 1,000 mg (1,000 mg Oral Given 01/18/16 0751)  cefTRIAXone (ROCEPHIN) injection 250 mg (250 mg Intramuscular Given 01/18/16 0751)     Initial Impression / Assessment and Plan / ED Course  I have reviewed the triage vital signs and the nursing notes.  Pertinent labs & imaging results that were available during my care of the patient were reviewed by me and considered in my medical decision making (see chart for details).  Clinical Course    Patient treated in the ED for STI with Ceftriaxone and azithromycin. Patient advised to inform and treat all sexual partners.  Pt advised on safe sex practices and understands that they have GC/Chlamydia cultures pending and will result in 2-3 days. Pt encouraged to follow up at local health department for future STI checks. No concern for prostatitis or epididymitis. Discussed return precautions. Pt appears safe for discharge.   Final Clinical Impressions(s) / ED Diagnoses   Final diagnoses:    Penile discharge  Dysuria  Concern about sexually transmitted disease in male without diagnosis    New Prescriptions New Prescriptions   No medications on file     Lona Kettleshley Laurel Alantra Popoca, PA-C 01/18/16 [id number]    Lyndal Pulleyaniel Knott, MD 01/18/16 [phone]

## 2016-01-18 NOTE — ED Notes (Signed)
Pt reports noted cloudy penile discharge this morning.  Admits to unprotected sex.  Mother at bedside.  PA into room

## 2016-01-18 NOTE — ED Triage Notes (Signed)
Pt. requesting treatment for STD reports penile ( white) discharge onset this morning with mild dysuria , denies fever or chills.

## 2016-02-02 ENCOUNTER — Telehealth: Payer: Self-pay

## 2016-02-02 DIAGNOSIS — J4520 Mild intermittent asthma, uncomplicated: Secondary | ICD-10-CM

## 2016-02-02 MED ORDER — ALBUTEROL SULFATE HFA 108 (90 BASE) MCG/ACT IN AERS: 2.0000 | INHALATION_SPRAY | Freq: Four times a day (QID) | RESPIRATORY_TRACT | 3 refills | Status: AC | PRN

## 2016-02-02 NOTE — Telephone Encounter (Signed)
This has been refilled.  Thanks. 

## 2016-03-01 MED FILL — VENTOLIN HFA 90 MCG INHALER: 108 (90 BAS | 25 days supply | Qty: 18 | Fill #0

## 2016-03-01 MED FILL — TRIAMCINOLONE 0.1% CREAM: 0.1 | 10 days supply | Qty: 30 | Fill #4

## 2016-03-01 MED FILL — QVAR 40 MCG ORAL INHALER: 40 | 30 days supply | Qty: 9 | Fill #2

## 2016-03-01 MED FILL — MONTELUKAST SOD 10 MG TAB: 10 | 30 days supply | Qty: 30 | Fill #3

## 2016-04-05 MED FILL — TRIAMCINOLONE 0.1% CREAM: 0.1 | 10 days supply | Qty: 30 | Fill #5

## 2016-04-05 MED FILL — VENTOLIN HFA 90 MCG INHALER: 108 (90 BAS | 25 days supply | Qty: 18 | Fill #1

## 2016-06-01 ENCOUNTER — Ambulatory Visit: Payer: No Typology Code available for payment source | Admitting: Family Medicine

## 2016-07-05 MED FILL — VENTOLIN HFA 90 MCG INHALER: 108 (90 BAS | 25 days supply | Qty: 18 | Fill #2

## 2016-08-27 ENCOUNTER — Other Ambulatory Visit: Payer: Self-pay | Admitting: Family Medicine

## 2016-08-27 DIAGNOSIS — J4520 Mild intermittent asthma, uncomplicated: Secondary | ICD-10-CM

## 2016-08-27 MED FILL — !VENTOLIN HFA INHALER: 108 (90 BAS | 25 days supply | Qty: 18 | Fill #3

## 2016-11-14 IMAGING — CT CT NECK W/ CM
3 of 4 series · 13 of 33 positions shown, 16 images · IV contrast (Omni 300)
Comparison: None

ADDENDUM:
Voice recognition error: The first sentence of the impression
section should read "2.9 cm right peritonsillar lesion likely
represents a developing peritonsillar abscess. There is no drainable
abscess at this time."
CLINICAL DATA: Dysphagia.  Tonsillar swelling.

EXAM:
CT NECK WITH CONTRAST
TECHNIQUE: Multidetector CT imaging of the neck was performed using the
standard protocol following the bolus administration of intravenous
contrast.
CONTRAST:  75mL OMNIPAQUE IOHEXOL 300 MG/ML  SOLN

[Series 3: neck 2.0 st · axial · 0.49mm/px · z∈[-365,-201]mm · 5 of 124 slices shown, 7 images (1 of 3)]
[im 21/124  soft-tissue]
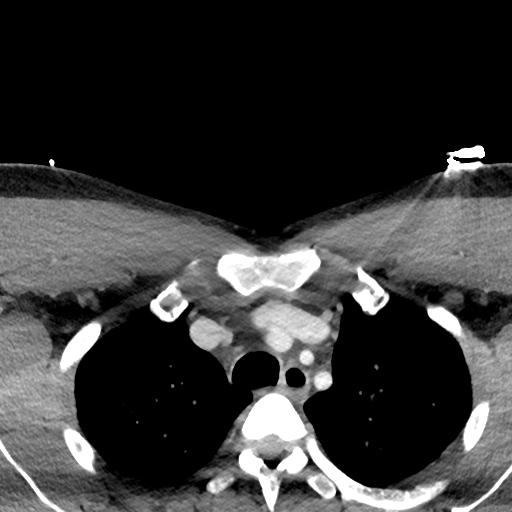
[im 21/124  bone]
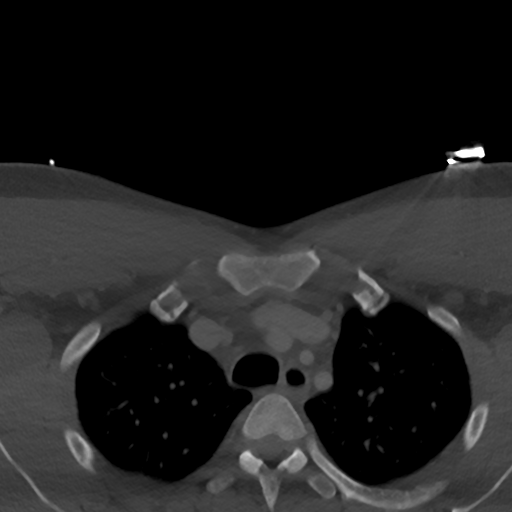
[im 42/124  bone]
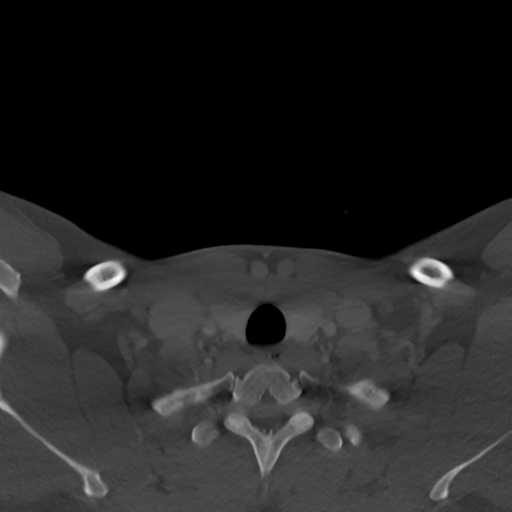
[im 62/124  bone]
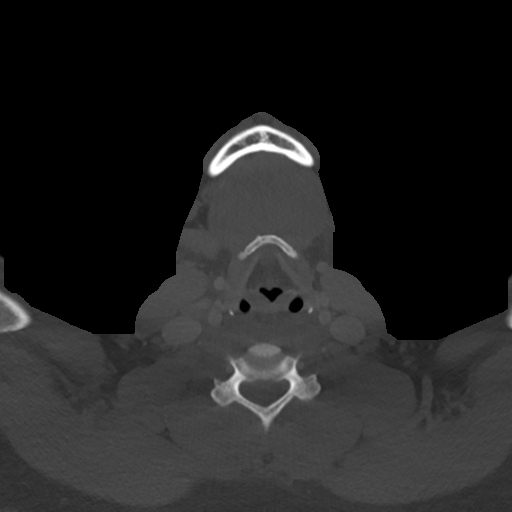
[im 83/124  bone]
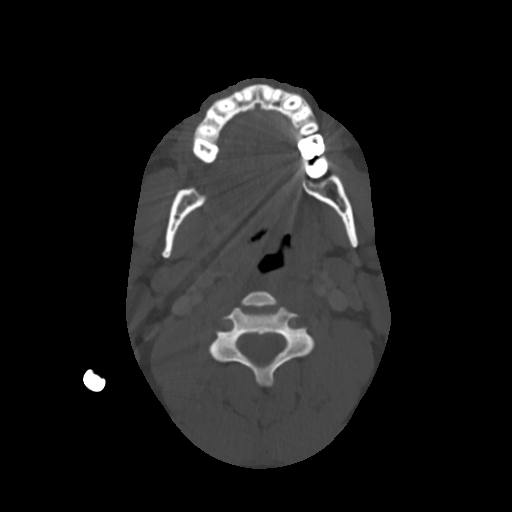
[im 103/124  soft-tissue]
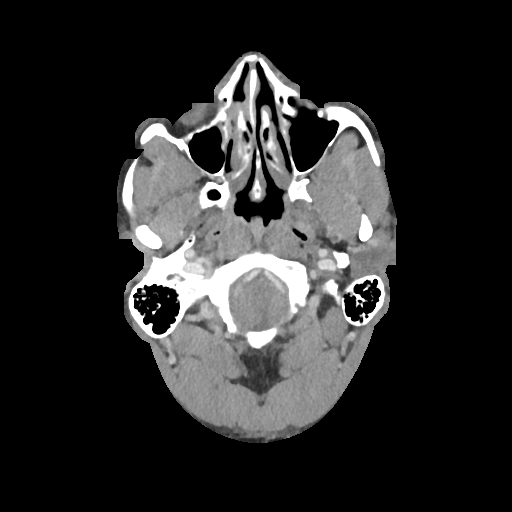
[im 103/124  bone]
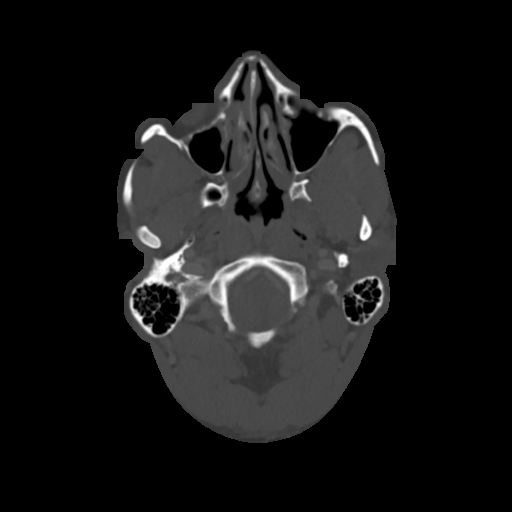

[Series 5: neck 2.0 st · sagittal · 0.48mm/px · 5 of 101 slices shown, 6 images (2 of 3)]
[im 34/101  bone]
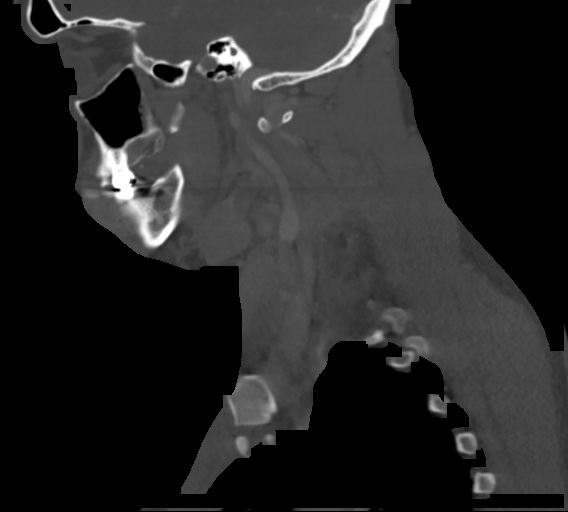
[im 42/101  bone]
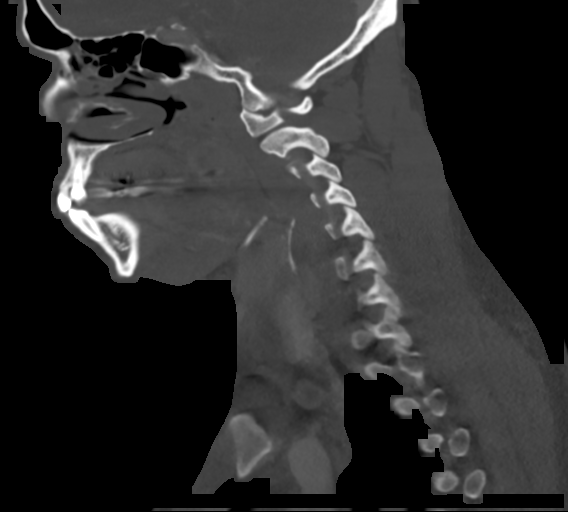
[im 51/101  soft-tissue]
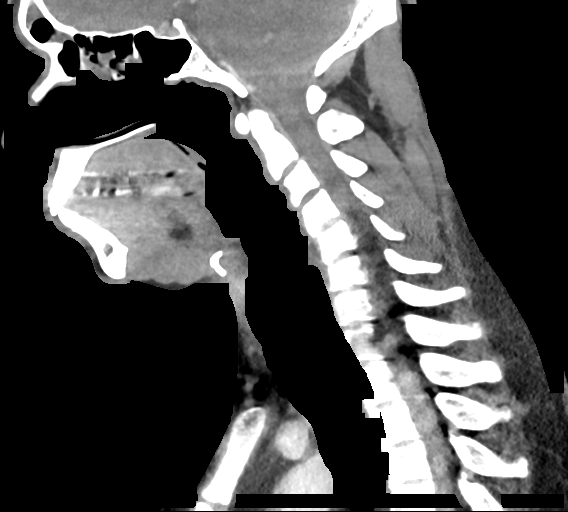
[im 51/101  bone]
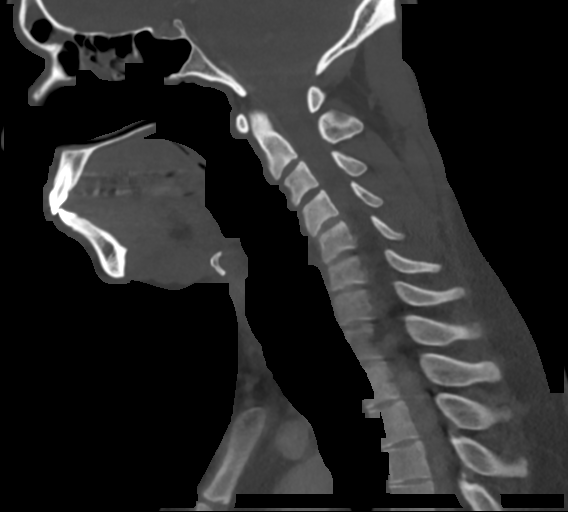
[im 59/101  bone]
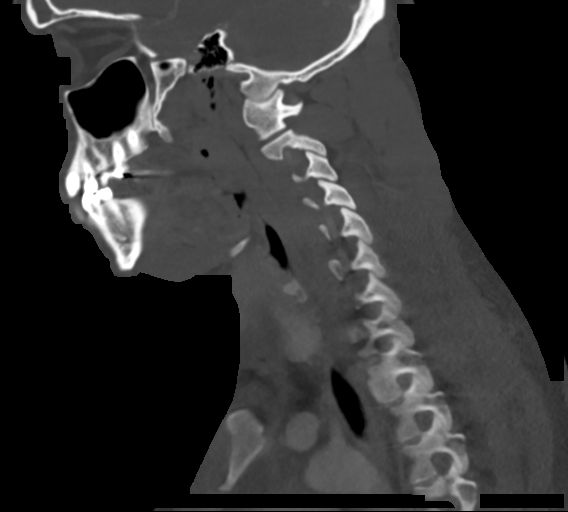
[im 67/101  bone]
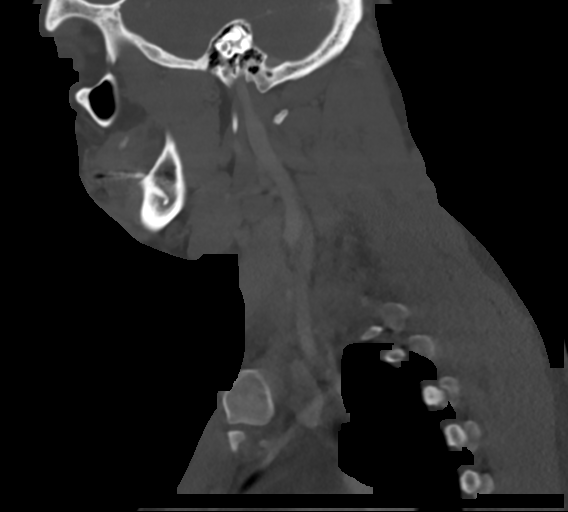

[Series 6: neck 2.0 st · coronal · 0.36mm/px · 3 of 144 slices shown (3 of 3)]
[im 29/144  bone]
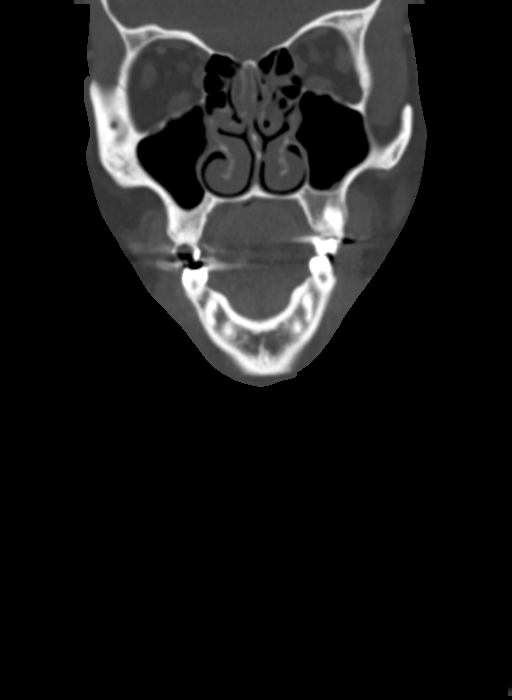
[im 58/144  bone]
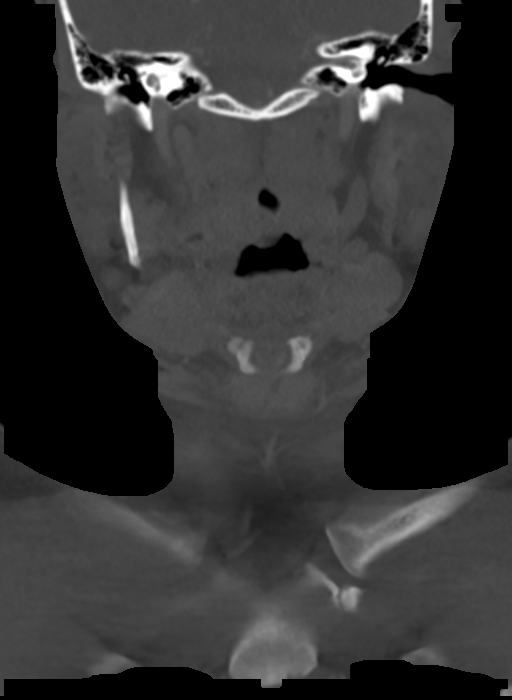
[im 86/144  bone]
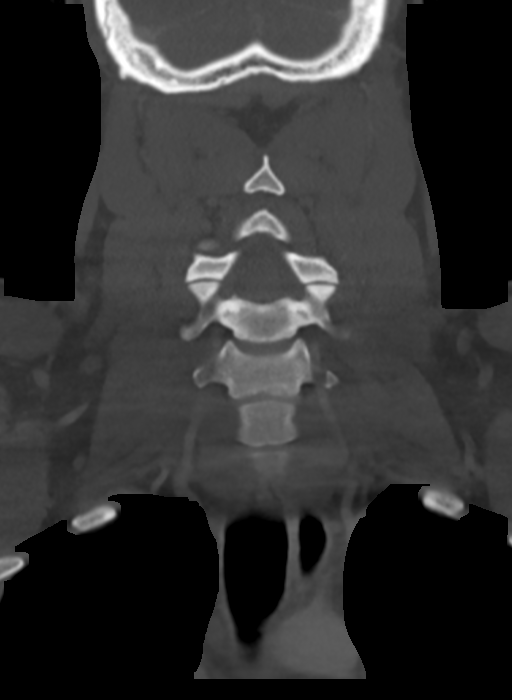

[13 of 33 positions shown; findings below may reference images not displayed]

FINDINGS: Pharynx and larynx: Asymmetric enhancement of the right tonsillar
area measures 2.7 x 2.5 x 2.9 Cm. This likely reflects focal
tonsillitis and potentially a developing peritonsillar abscess.
There is inflammation and less distinct enhancement of the left
palatine tonsil is well. This creates some narrowing of the oral
pharyngeal airway. The tongue base is within normal limits. The
vocal cords are closed. No other focal mucosal lesions are evident.

Salivary glands: The submandibular and parotid glands are normal
bilaterally.

Thyroid: Negative

Lymph nodes: Enlarged right level 2 lymph node 1 oral mass measures
1.7 x 2.3 by 1.7 cm. Two additional posterior right level 2 lymph
nodes measure 1.3 and 1.4 cm respectively. The largest right
submandibular node measures 13 mm in long axis. Smaller left-sided
reactive type lymph nodes are present.

Vascular: Negative.

Limited intracranial: Within normal limits

Visualized orbits: Globes and orbits are intact.

Mastoids and visualized paranasal sinuses: A small fluid level is
present in the right maxillary sinus. The remaining paranasal
sinuses and the mastoid air cells are clear.

Skeleton: Bone windows demonstrate slight reversal of the normal
cervical lordosis. No focal lytic or blastic lesions are present.
Vertebral body heights and alignment are maintained.

Upper chest: The lung apices are clear.
IMPRESSION: 1. 2.9 cm right peritonsillar lesion likely represents vein at
developing peritonsillar abscess. There is no drainable abscess at
this time.
2. Bilateral palatine tonsil inflammatory change compatible with
acute tonsillitis.
3. Right greater than left cervical adenopathy is likely reactive.
